# Patient Record
Sex: Male | Born: 1970
Health system: Southern US, Community
[De-identification: ages and names within clinical notes are randomized; demographics above are authoritative.]

## PROBLEM LIST (undated history)

## (undated) DIAGNOSIS — T7840XA Allergy, unspecified, initial encounter: Secondary | ICD-10-CM

## (undated) DIAGNOSIS — J302 Other seasonal allergic rhinitis: Secondary | ICD-10-CM

## (undated) DIAGNOSIS — J45909 Unspecified asthma, uncomplicated: Secondary | ICD-10-CM

## (undated) HISTORY — DX: Allergy, unspecified, initial encounter: T78.40XA

---

## 2006-08-30 ENCOUNTER — Ambulatory Visit: Payer: Self-pay | Admitting: Family Medicine

## 2006-09-02 ENCOUNTER — Encounter: Payer: Self-pay | Admitting: Family Medicine

## 2006-09-02 LAB — CONVERTED CEMR LAB
AST: 23 units/L (ref 0–37)
Albumin: 4.5 g/dL (ref 3.5–5.2)
BUN: 11 mg/dL (ref 6–23)
CO2: 27 meq/L (ref 19–32)
Calcium: 9.7 mg/dL (ref 8.4–10.5)
Cholesterol, target level: 200 mg/dL
Creatinine, Ser: 1.15 mg/dL (ref 0.40–1.50)
Eosinophils Relative: 3 % (ref 0–5)
HCT: 49.6 % (ref 39.0–52.0)
HDL: 36 mg/dL — ABNORMAL LOW (ref >39)
Hemoglobin: 16.2 g/dL (ref 13.0–17.0)
LDL Cholesterol: 89 mg/dL (ref 0–99)
Lymphs Abs: 1.7 10*3/uL (ref 0.7–3.3)
MCHC: 32.7 g/dL (ref 30.0–36.0)
Monocytes Relative: 9 % (ref 3–11)
RBC: 5.6 M/uL (ref 4.22–5.81)
Sodium: 140 meq/L (ref 135–145)
Total Bilirubin: 0.6 mg/dL (ref 0.3–1.2)
Total CHOL/HDL Ratio: 4.2
Triglycerides: 137 mg/dL (ref <150)
VLDL: 27 mg/dL (ref 0–40)
WBC: 5.6 10*3/uL (ref 4.0–10.5)

## 2006-10-30 ENCOUNTER — Encounter: Payer: Self-pay | Admitting: Family Medicine

## 2006-11-22 ENCOUNTER — Ambulatory Visit: Payer: Self-pay | Admitting: Family Medicine

## 2006-11-22 DIAGNOSIS — D179 Benign lipomatous neoplasm, unspecified: Secondary | ICD-10-CM | POA: Insufficient documentation

## 2006-11-22 DIAGNOSIS — E785 Hyperlipidemia, unspecified: Secondary | ICD-10-CM | POA: Insufficient documentation

## 2007-02-12 ENCOUNTER — Ambulatory Visit: Payer: Self-pay | Admitting: Family Medicine

## 2007-02-12 DIAGNOSIS — K219 Gastro-esophageal reflux disease without esophagitis: Secondary | ICD-10-CM | POA: Insufficient documentation

## 2007-02-12 DIAGNOSIS — J309 Allergic rhinitis, unspecified: Secondary | ICD-10-CM | POA: Insufficient documentation

## 2007-04-03 ENCOUNTER — Ambulatory Visit: Payer: Self-pay | Admitting: Family Medicine

## 2007-04-03 DIAGNOSIS — J329 Chronic sinusitis, unspecified: Secondary | ICD-10-CM | POA: Insufficient documentation

## 2007-04-03 DIAGNOSIS — J45901 Unspecified asthma with (acute) exacerbation: Secondary | ICD-10-CM | POA: Insufficient documentation

## 2007-04-04 LAB — CONVERTED CEMR LAB
Albumin: 4.7 g/dL (ref 3.5–5.2)
Chloride: 105 meq/L (ref 96–112)
Glucose, Bld: 138 mg/dL — ABNORMAL HIGH (ref 70–99)
Sodium: 137 meq/L (ref 135–145)
Total Protein: 7.7 g/dL (ref 6.0–8.3)

## 2007-09-12 ENCOUNTER — Ambulatory Visit: Payer: Self-pay | Admitting: Family Medicine

## 2007-09-17 ENCOUNTER — Encounter: Admission: RE | Admit: 2007-09-17 | Discharge: 2007-09-17 | Payer: Self-pay | Admitting: Family Medicine

## 2007-09-17 ENCOUNTER — Telehealth: Payer: Self-pay | Admitting: Family Medicine

## 2007-09-17 ENCOUNTER — Encounter: Payer: Self-pay | Admitting: Family Medicine

## 2007-09-17 DIAGNOSIS — K7689 Other specified diseases of liver: Secondary | ICD-10-CM | POA: Insufficient documentation

## 2007-09-18 LAB — CONVERTED CEMR LAB
AST: 23 units/L (ref 0–37)
Albumin: 4.6 g/dL (ref 3.5–5.2)
CO2: 24 meq/L (ref 19–32)
Calcium: 9.8 mg/dL (ref 8.4–10.5)
Chloride: 104 meq/L (ref 96–112)
Creatinine, Ser: 1.1 mg/dL (ref 0.40–1.50)
Glucose, Bld: 107 mg/dL — ABNORMAL HIGH (ref 70–99)
Lymphocytes Relative: 24 % (ref 12–46)
Lymphs Abs: 1.5 10*3/uL (ref 0.7–4.0)
MCV: 85.5 fL (ref 78.0–100.0)
Monocytes Absolute: 0.5 10*3/uL (ref 0.1–1.0)
Neutro Abs: 4.3 10*3/uL (ref 1.7–7.7)
Platelets: 283 10*3/uL (ref 150–400)
Potassium: 4.6 meq/L (ref 3.5–5.3)
Sodium: 140 meq/L (ref 135–145)
Total Bilirubin: 0.4 mg/dL (ref 0.3–1.2)

## 2007-09-20 ENCOUNTER — Ambulatory Visit (HOSPITAL_COMMUNITY): Admission: RE | Admit: 2007-09-20 | Discharge: 2007-09-20 | Payer: Self-pay | Admitting: Family Medicine

## 2007-09-26 ENCOUNTER — Encounter: Payer: Self-pay | Admitting: Family Medicine

## 2007-10-03 ENCOUNTER — Encounter: Payer: Self-pay | Admitting: Family Medicine

## 2007-10-06 ENCOUNTER — Encounter: Payer: Self-pay | Admitting: Family Medicine

## 2007-11-13 ENCOUNTER — Telehealth: Payer: Self-pay | Admitting: Family Medicine

## 2008-11-16 ENCOUNTER — Ambulatory Visit: Payer: Self-pay | Admitting: Family Medicine

## 2009-02-02 ENCOUNTER — Ambulatory Visit: Payer: Self-pay | Admitting: Family Medicine

## 2009-02-02 DIAGNOSIS — K828 Other specified diseases of gallbladder: Secondary | ICD-10-CM | POA: Insufficient documentation

## 2009-02-03 ENCOUNTER — Encounter: Admission: RE | Admit: 2009-02-03 | Discharge: 2009-02-03 | Payer: Self-pay | Admitting: Family Medicine

## 2009-02-03 LAB — CONVERTED CEMR LAB
ALT: 43 units/L (ref 0–53)
BUN: 14 mg/dL (ref 6–23)
CO2: 23 meq/L (ref 19–32)
Calcium: 9.8 mg/dL (ref 8.4–10.5)
Chloride: 103 meq/L (ref 96–112)
Cholesterol: 181 mg/dL (ref 0–200)
Glucose, Bld: 92 mg/dL (ref 70–99)
HDL: 35 mg/dL — ABNORMAL LOW (ref >39)
LDL Cholesterol: 122 mg/dL — ABNORMAL HIGH (ref 0–99)
Triglycerides: 120 mg/dL (ref <150)
VLDL: 24 mg/dL (ref 0–40)

## 2009-04-27 ENCOUNTER — Ambulatory Visit: Payer: Self-pay | Admitting: Family Medicine

## 2009-06-08 ENCOUNTER — Ambulatory Visit: Payer: Self-pay | Admitting: Family Medicine

## 2009-06-08 ENCOUNTER — Telehealth: Payer: Self-pay | Admitting: Family Medicine

## 2009-06-08 ENCOUNTER — Encounter: Admission: RE | Admit: 2009-06-08 | Discharge: 2009-06-08 | Payer: Self-pay | Admitting: Family Medicine

## 2009-06-17 ENCOUNTER — Encounter: Payer: Self-pay | Admitting: Family Medicine

## 2009-06-22 ENCOUNTER — Encounter: Payer: Self-pay | Admitting: Family Medicine

## 2009-06-23 ENCOUNTER — Encounter: Payer: Self-pay | Admitting: Family Medicine

## 2009-06-24 ENCOUNTER — Encounter: Payer: Self-pay | Admitting: Family Medicine

## 2009-10-21 ENCOUNTER — Ambulatory Visit: Payer: Self-pay | Admitting: Family Medicine

## 2009-11-01 ENCOUNTER — Ambulatory Visit: Payer: Self-pay | Admitting: Family Medicine

## 2010-02-27 ENCOUNTER — Encounter: Payer: Self-pay | Admitting: Family Medicine

## 2010-03-08 NOTE — Letter (Signed)
Summary: Letter to Patient with Path Results/[facility]  Letter to Patient with Path Results/[facility]   Imported By: Lanelle Bal 08/03/2009 11:57:47  _____________________________________________________________________  External Attachment:    Type:   Image     Comment:   External Document

## 2010-03-08 NOTE — Assessment & Plan Note (Signed)
Summary: Sinusitis   Vital Signs:  Patient profile:   40 year old male Height:      68.5 inches Weight:      193 pounds BMI:     29.02 O2 Sat:      96 % on Room air Temp:     97.9 degrees F oral Pulse rate:   81 / minute BP sitting:   119 / 60  (right arm) Cuff size:   regular  Vitals Entered By: Avon Gully CMA, (AAMA) (November 01, 2009 1:50 PM)  O2 Flow:  Room air CC: productive cough,HA,chills,fever   Primary Care Provider:  Nani Gasser MD  CC:  productive cough, HA, chills, and fever.  History of Present Illness: productive cough,HA,chills,fever. Yesterday ran a fever.  Took IBUprofen.  No new sxs. Not better overall. Now having loose bowels.   Current Medications (verified): 1)  Proventil Hfa 108 (90 Base) Mcg/act  Aers (Albuterol Sulfate) .... 2 Puffs Q 6 Hrs Prn 2)  Allegra-D 12 Hour 60-120 Mg  Tb12 (Fexofenadine-Pseudoephedrine) .Marland Kitchen.. 1 Tab By Mouth Q 12 Hrs As Needed Allergies 3)  Albuterol Sulfate 0.63 Mg/4ml  Nebu (Albuterol Sulfate) .... One Neb Tx Two Times A Day As Needed 4)  Flovent Hfa 110 Mcg/act Aero (Fluticasone Propionate  Hfa) .... 2 Puffs Inhaled Two Times A Day  Allergies (verified): No Known Drug Allergies  Comments:  Nurse/Medical Assistant: The patient's medications and allergies were reviewed with the patient and were updated in the Medication and Allergy Lists. Avon Gully CMA, Duncan Dull) (November 01, 2009 1:52 PM)  Physical Exam  General:  Well-developed,well-nourished,in no acute distress; alert,appropriate and cooperative throughout examination Head:  Normocephalic and atraumatic without obvious abnormalities. No apparent alopecia or balding. Eyes:  No corneal or conjunctival inflammation noted. EOMI. Perrla. Ears:  External ear exam shows no significant lesions or deformities.  Otoscopic examination reveals clear canals, tympanic membranes are intact bilaterally without bulging, retraction, inflammation or discharge.  Hearing is grossly normal bilaterally. Nose:  External nasal examination shows no deformity or inflammation. Nasal mucosa are pink and moist without lesions or exudates. Mouth:  Oral mucosa and oropharynx without lesions or exudates.  Teeth in good repair. Neck:  No deformities, masses, or tenderness noted. Lungs:  Normal respiratory effort, chest expands symmetrically. Lungs are clear to auscultation, no crackles or wheezes. Heart:  Normal rate and regular rhythm. S1 and S2 normal without gallop, murmur, click, rub or other extra sounds. Skin:  no rashes.   Psych:  Cognition and judgment appear intact. Alert and cooperative with normal attention span and concentration. No apparent delusions, illusions, hallucinations   Impression & Recommendations:  Problem # 1:  SINUSITIS (ICD-473.9)  His updated medication list for this problem includes:    Allegra-d 12 Hour 60-120 Mg Tb12 (Fexofenadine-pseudoephedrine) .Marland Kitchen... 1 tab by mouth q 12 hrs as needed allergies    Amoxicillin 875 Mg Tabs (Amoxicillin) .Marland Kitchen... Take 1 tablet by mouth two times a day for 10 days    Tessalon 200 Mg Caps (Benzonatate) .Marland Kitchen... Take 1 tablet by mouth three times a day as needed  Take antibiotics for full duration.   Complete Medication List: 1)  Proventil Hfa 108 (90 Base) Mcg/act Aers (Albuterol sulfate) .... 2 puffs q 6 hrs prn 2)  Allegra-d 12 Hour 60-120 Mg Tb12 (Fexofenadine-pseudoephedrine) .Marland Kitchen.. 1 tab by mouth q 12 hrs as needed allergies 3)  Albuterol Sulfate 0.63 Mg/81ml Nebu (Albuterol sulfate) .... One neb tx two times a day as needed 4)  Flovent Hfa 110 Mcg/act Aero (Fluticasone propionate  hfa) .... 2 puffs inhaled two times a day 5)  Amoxicillin 875 Mg Tabs (Amoxicillin) .... Take 1 tablet by mouth two times a day for 10 days 6)  Tessalon 200 Mg Caps (Benzonatate) .... Take 1 tablet by mouth three times a day as needed  Patient Instructions: 1)  Finish the antibiotic. Calll if not better if by the end of the  week.  Prescriptions: TESSALON 200 MG CAPS (BENZONATATE) Take 1 tablet by mouth three times a day as needed  #60 x 0   Entered and Authorized by:   Nani Gasser MD   Signed by:   Nani Gasser MD on 11/01/2009   Method used:   Electronically to        [facility] [phone]* (retail)       [address]       [place], [place]  [address]       Ph: [phone]       Fax: [phone]   RxID:   [id number] AMOXICILLIN 875 MG TABS (AMOXICILLIN) Take 1 tablet by mouth two times a day for 10 days  #20 x 0   Entered and Authorized by:   Nani Gasser MD   Signed by:   Nani Gasser MD on 11/01/2009   Method used:   Electronically to        [facility] [phone]* (retail)       [address]       [place], [place]  [address]       Ph: [phone]       Fax: [phone]   RxID:   [id number]

## 2010-03-08 NOTE — Miscellaneous (Signed)
Summary: colonoscopy  Clinical Lists Changes  Observations: Added new observation of COLONRECACT: Repeat colonoscopy in 10 years.   (06/22/2009 12:41) Added new observation of COLONOSCOPY: Location:  [facility].    erythema and infalmmation in the gastroesophageal junction compatible with esophagitis.  erythema in the duodenal bulb compatible with duodenitis. normal colonoscopy to the terminal ileum. (06/22/2009 12:41)      Colonoscopy  Procedure date:  06/22/2009  Findings:      Location:  [facility].    erythema and infalmmation in the gastroesophageal junction compatible with esophagitis.  erythema in the duodenal bulb compatible with duodenitis. normal colonoscopy to the terminal ileum.  Comments:      Repeat colonoscopy in 10 years.     Colonoscopy  Procedure date:  06/22/2009  Findings:      Location:  [facility].    erythema and infalmmation in the gastroesophageal junction compatible with esophagitis.  erythema in the duodenal bulb compatible with duodenitis. normal colonoscopy to the terminal ileum.  Comments:      Repeat colonoscopy in 10 years.

## 2010-03-08 NOTE — Assessment & Plan Note (Signed)
Summary: Enteritis   Vital Signs:  Patient profile:   40 year old male Height:      68.5 inches Weight:      191 pounds Temp:     97.7 degrees F oral Pulse rate:   66 / minute BP sitting:   115 / 65  (left arm) Cuff size:   regular  Vitals Entered By: Kathlene November (April 27, 2009 2:39 PM) CC: diarrhea since yesterday- getting better today but needs note for work for yesterday and today   Primary Care Provider:  Nani Gasser MD  CC:  diarrhea since yesterday- getting better today but needs note for work for yesterday and today.  History of Present Illness: diarrhea since yesterday- getting better today but needs note for work for yesterday and today.  No N?V. No blood in teh stool. Stools were green. Thinks had enteritis.  Feeling better today. Less frequent.  Not quite formed stools. No new abdominal pain or fever.  Not taking any meds for it. has tired to inbcrase hydration.   Current Medications (verified): 1)  Proventil Hfa 108 (90 Base) Mcg/act  Aers (Albuterol Sulfate) .... 2 Puffs Q 6 Hrs Prn 2)  Allegra-D 12 Hour 60-120 Mg  Tb12 (Fexofenadine-Pseudoephedrine) .Marland Kitchen.. 1 Tab By Mouth Q 12 Hrs As Needed Allergies 3)  Albuterol Sulfate 0.63 Mg/23ml  Nebu (Albuterol Sulfate) .... One Neb Tx Two Times A Day As Needed 4)  Flovent Hfa 110 Mcg/act Aero (Fluticasone Propionate  Hfa) .... 2 Puffs Inhaled Two Times A Day  Allergies (verified): No Known Drug Allergies  Comments:  Nurse/Medical Assistant: The patient's medications and allergies were reviewed with the patient and were updated in the Medication and Allergy Lists. Kathlene November (April 27, 2009 2:40 PM)  Past History:  Past Surgical History: Last updated: 08/30/2006 Vasectomy Knee scope surgery-right, meniscus repair  Physical Exam  General:  Well-developed,well-nourished,in no acute distress; alert,appropriate and cooperative throughout examination Lungs:  Normal respiratory effort, chest expands symmetrically.  Lungs are clear to auscultation, no crackles or wheezes. Heart:  Normal rate and regular rhythm. S1 and S2 normal without gallop, murmur, click, rub or other extra sounds. Abdomen:  Bowel sounds positive,abdomen soft and non-tender without masses, organomegaly or hernias noted.   Impression & Recommendations:  Problem # 1:  ENTERITIS (ICD-558.9) Likely viral.  Increase hydration. Call if not better in 2-3 days.  Will get work note.  Avoid immodium.   Complete Medication List: 1)  Proventil Hfa 108 (90 Base) Mcg/act Aers (Albuterol sulfate) .... 2 puffs q 6 hrs prn 2)  Allegra-d 12 Hour 60-120 Mg Tb12 (Fexofenadine-pseudoephedrine) .Marland Kitchen.. 1 tab by mouth q 12 hrs as needed allergies 3)  Albuterol Sulfate 0.63 Mg/41ml Nebu (Albuterol sulfate) .... One neb tx two times a day as needed 4)  Flovent Hfa 110 Mcg/act Aero (Fluticasone propionate  hfa) .... 2 puffs inhaled two times a day

## 2010-03-08 NOTE — Consult Note (Signed)
Summary: [facility]  [facility]   Imported By: Lanelle Bal 06/27/2009 13:03:12  _____________________________________________________________________  External Attachment:    Type:   Image     Comment:   External Document

## 2010-03-08 NOTE — Assessment & Plan Note (Signed)
Summary: URI   Vital Signs:  Patient profile:   40 year old male Height:      68.5 inches Weight:      193 pounds O2 Sat:      98 % on Room air Temp:     97.9 degrees F oral Pulse rate:   65 / minute BP sitting:   124 / 64  (left arm) Cuff size:   regular  Vitals Entered By: Kathlene November (October 21, 2009 9:39 AM)  O2 Flow:  Room air CC: bodyaches, H/A, chest congestion, cough x 3 days   Primary Care [place] Rommel:  Nani Gasser MD  CC:  bodyaches, H/A, chest congestion, and cough x 3 days.  History of Present Illness: bodyaches, H/A, chest congestion, cough x 3 days. No fever. Taking cough medicine. NO GI sxs. Son had similar illnes on Monday and now 5 days later he is better.   Current Medications (verified): 1)  Proventil Hfa 108 (90 Base) Mcg/act  Aers (Albuterol Sulfate) .... 2 Puffs Q 6 Hrs Prn 2)  Allegra-D 12 Hour 60-120 Mg  Tb12 (Fexofenadine-Pseudoephedrine) .Marland Kitchen.. 1 Tab By Mouth Q 12 Hrs As Needed Allergies 3)  Albuterol Sulfate 0.63 Mg/35ml  Nebu (Albuterol Sulfate) .... One Neb Tx Two Times A Day As Needed 4)  Flovent Hfa 110 Mcg/act Aero (Fluticasone Propionate  Hfa) .... 2 Puffs Inhaled Two Times A Day  Allergies (verified): No Known Drug Allergies  Comments:  Nurse/Medical Assistant: The patient's medications and allergies were reviewed with the patient and were updated in the Medication and Allergy Lists. Kathlene November (October 21, 2009 9:40 AM)  Social History: Works for [employer].   Married to [employer] with 3 kids.  30 year old from first marriage.  Former Smoker Alcohol use-no Drug use-no Regular exercise-no  Physical Exam  General:  Well-developed,well-nourished,in no acute distress; alert,appropriate and cooperative throughout examination Head:  Normocephalic and atraumatic without obvious abnormalities. No apparent alopecia or balding. Eyes:  No corneal or conjunctival inflammation noted. EOMI. Perrla.l. Ears:  External ear exam shows no  significant lesions or deformities.  Otoscopic examination reveals clear canals, tympanic membranes are intact bilaterally without bulging, retraction, inflammation or discharge. Hearing is grossly normal bilaterally. Nose:  External nasal examination shows no deformity or inflammation. Nasal mucosa are pink and moist without lesions or exudates. Mouth:  Oral mucosa and oropharynx without lesions or exudates.  Teeth in good repair. Neck:  No deformities, masses, or tenderness noted. Lungs:  Normal respiratory effort, chest expands symmetrically. Lungs are clear to auscultation, no crackles or wheezes. Heart:  Normal rate and regular rhythm. S1 and S2 normal without gallop, murmur, click, rub or other extra sounds. Skin:  no rashes.   Cervical Nodes:  No lymphadenopathy noted Psych:  Cognition and judgment appear intact. Alert and cooperative with normal attention span and concentration. No apparent delusions, illusions, hallucinations   Impression & Recommendations:  Problem # 1:  URI (ICD-465.9)  His updated medication list for this problem includes:    Allegra-d 12 Hour 60-120 Mg Tb12 (Fexofenadine-pseudoephedrine) .Marland Kitchen... 1 tab by mouth q 12 hrs as needed allergies  Instructed on symptomatic treatment. Call if symptoms persist or worsen.   Complete Medication List: 1)  Proventil Hfa 108 (90 Base) Mcg/act Aers (Albuterol sulfate) .... 2 puffs q 6 hrs prn 2)  Allegra-d 12 Hour 60-120 Mg Tb12 (Fexofenadine-pseudoephedrine) .Marland Kitchen.. 1 tab by mouth q 12 hrs as needed allergies 3)  Albuterol Sulfate 0.63 Mg/38ml Nebu (Albuterol sulfate) .Marland KitchenMarland KitchenMarland Kitchen  One neb tx two times a day as needed 4)  Flovent Hfa 110 Mcg/act Aero (Fluticasone propionate  hfa) .... 2 puffs inhaled two times a day  Patient Instructions: 1)  This is viral.  2)  Call if not better next week.

## 2010-03-08 NOTE — Progress Notes (Signed)
Summary: wants xray results  Phone Note Call from Patient   Caller: Patient Summary of Call: Pt. would like to know his xray results and wants a call back at [phone].Michaelle Copas  Jun 08, 2009 11:32 AM  Initial call taken by: Michaelle Copas,  Jun 08, 2009 11:32 AM  Follow-up for Phone Call        pt notified of results Follow-up by: Kathlene November,  Jun 08, 2009 11:38 AM

## 2010-03-08 NOTE — Letter (Signed)
Summary: Letter with Colonoscopy & EGD Results/[facility]  Letter with Colonoscopy & EGD Results/[facility]   Imported By: Lanelle Bal 07/07/2009 11:22:25  _____________________________________________________________________  External Attachment:    Type:   Image     Comment:   External Document

## 2010-03-08 NOTE — Letter (Signed)
Summary: Out of Work  [facility]  [address]   [place], [place] [address]   Phone: [phone]  Fax: [phone]    April 27, 2009   Employee:  Bradley Bernard    To Whom It May Concern:   For Medical reasons, please excuse the above named employee from work for the following dates:  Start:   04/26/2009  End:   04/28/2009  If you need additional information, please feel free to contact our office.         Sincerely,    Nani Gasser MD

## 2010-03-08 NOTE — Assessment & Plan Note (Signed)
Summary: RLQ pain   Vital Signs:  Patient profile:   40 year old male Height:      68.5 inches Weight:      195 pounds Pulse rate:   60 / minute BP sitting:   121 / 69  (left arm) Cuff size:   regular  Vitals Entered By: Kathlene November (Jun 08, 2009 8:57 AM) CC: right sided flank pain- bloated- BM not real regular   Primary Care Provider:  Nani Gasser MD  CC:  right sided flank pain- bloated- BM not real regular.  History of Present Illness: Right sided flank pain- bloated- BM not real regular.  No blood in the stool.  only happens when he overeats.  When eats well doesn't happen.  Gets more swollen on teh right side. Spicy foods also trigger teh bloating.  He says it doesn't really hurt but "It is a mild discomfort".  Has some pain last night.  Pain is mostly in the RLQ.  Blaoting is all over.  Struggles with constipation.     Current Medications (verified): 1)  Proventil Hfa 108 (90 Base) Mcg/act  Aers (Albuterol Sulfate) .... 2 Puffs Q 6 Hrs Prn 2)  Allegra-D 12 Hour 60-120 Mg  Tb12 (Fexofenadine-Pseudoephedrine) .Marland Kitchen.. 1 Tab By Mouth Q 12 Hrs As Needed Allergies 3)  Albuterol Sulfate 0.63 Mg/19ml  Nebu (Albuterol Sulfate) .... One Neb Tx Two Times A Day As Needed 4)  Flovent Hfa 110 Mcg/act Aero (Fluticasone Propionate  Hfa) .... 2 Puffs Inhaled Two Times A Day  Allergies (verified): No Known Drug Allergies  Comments:  Nurse/Medical Assistant: The patient's medications and allergies were reviewed with the patient and were updated in the Medication and Allergy Lists. Kathlene November (Jun 08, 2009 8:58 AM)  Physical Exam  General:  Well-developed,well-nourished,in no acute distress; alert,appropriate and cooperative throughout examination Head:  Normocephalic and atraumatic without obvious abnormalities. No apparent alopecia or balding. Lungs:  Normal respiratory effort, chest expands symmetrically. Lungs are clear to auscultation, no crackles or wheezes. Heart:  Normal  rate and regular rhythm. S1 and S2 normal without gallop, murmur, click, rub or other extra sounds. Abdomen:  soft, normal bowel sounds, no distention, no masses, no hepatomegaly, and no splenomegaly.  Mildly tender with deep palpation in the RLQ.   Skin:  no rashes.   Psych:  Cognition and judgment appear intact. Alert and cooperative with normal attention span and concentration. No apparent delusions, illusions, hallucinations   Impression & Recommendations:  Problem # 1:  ABDOMINAL PAIN, RIGHT LOWER QUADRANT (ICD-789.03) Dsicussed likely stool as seems to be present only when overeats or eats poorly.  Discussed potential w/u. Will start with a KUB. IF abnormal then will start wtih Miralax two times a day which is safe and doesn't cause cramping.  If normal then will refer to GI. Or if start mirilax and not better then will refer to GI for further evaluation.  Orders: T-*Unlisted Diagnostic X-ray test/procedure ([address])  Complete Medication List: 1)  Proventil Hfa 108 (90 Base) Mcg/act Aers (Albuterol sulfate) .... 2 puffs q 6 hrs prn 2)  Allegra-d 12 Hour 60-120 Mg Tb12 (Fexofenadine-pseudoephedrine) .Marland Kitchen.. 1 tab by mouth q 12 hrs as needed allergies 3)  Albuterol Sulfate 0.63 Mg/35ml Nebu (Albuterol sulfate) .... One neb tx two times a day as needed 4)  Flovent Hfa 110 Mcg/act Aero (Fluticasone propionate  hfa) .... 2 puffs inhaled two times a day

## 2010-03-08 NOTE — Letter (Signed)
Summary: Out of Work  [facility]  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         Sincerely,    Nani Gasser MD

## 2010-03-08 NOTE — Letter (Signed)
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         Sincerely,    Nani Gasser MD

## 2010-04-25 ENCOUNTER — Ambulatory Visit (INDEPENDENT_AMBULATORY_CARE_PROVIDER_SITE_OTHER): Payer: 59 | Admitting: Family Medicine

## 2010-04-25 VITALS — BP 119/78 | HR 87 | Wt 194.0 lb

## 2010-04-25 DIAGNOSIS — K219 Gastro-esophageal reflux disease without esophagitis: Secondary | ICD-10-CM

## 2010-04-25 MED ORDER — DEXLANSOPRAZOLE 60 MG PO CPDR: 60.0000 mg | DELAYED_RELEASE_CAPSULE | Freq: Every day | ORAL | Status: DC

## 2010-04-25 NOTE — Progress Notes (Signed)
  Subjective:    Patient ID: Bradley Bernard, male    DOB: 10-20-1970, 40 y.o.   MRN: [id number]  Back Pain This is a new problem. The current episode started in the past 7 days. The problem occurs daily. The problem has been gradually improving since onset. The pain is present in the lumbar spine. The quality of the pain is described as aching (couldnt wear a belt. ). The pain does not radiate. The pain is moderate. The pain is worse during the night. The symptoms are aggravated by sitting and bending. Stiffness is present all day. Pertinent negatives include no bladder incontinence, bowel incontinence or weakness. He has tried NSAIDs for the symptoms. The treatment provided moderate relief.    Missed 3 days of work for this.   Review of Systems  Gastrointestinal: Negative for bowel incontinence.  Genitourinary: Negative for bladder incontinence.  Musculoskeletal: Positive for back pain.  Neurological: Negative for weakness.       Objective:   Physical Exam  Vitals reviewed. Constitutional: He appears well-developed and well-nourished.  Musculoskeletal: Normal range of motion.       Lumbar back: He exhibits tenderness and spasm. He exhibits normal range of motion, no bony tenderness and no deformity.       Back:       Neg straight leg raise.  Hip, knee, and ankle strength 5/5 bilat.    BP 119/78  Pulse 87  Wt 194 lb (87.998 kg)       Assessment & Plan:  1. Acute BAck Pain- Discussed appropriate use of NSAIDs.  Avoid if any GI irritation. Says he already has some stretches at home and he says she will use them.  Overall he is better. Hold off on additional pain meds or muscle relaxers. Given work notes.

## 2010-04-25 NOTE — Patient Instructions (Signed)
Continue Ibuprofen up to 3 x a day. Stop immediately if bothers your stomach.  Work on gentle stretches.

## 2010-08-14 ENCOUNTER — Encounter: Payer: Self-pay | Admitting: Family Medicine

## 2010-08-14 ENCOUNTER — Ambulatory Visit (INDEPENDENT_AMBULATORY_CARE_PROVIDER_SITE_OTHER): Payer: 59 | Admitting: Family Medicine

## 2010-08-14 DIAGNOSIS — K5289 Other specified noninfective gastroenteritis and colitis: Secondary | ICD-10-CM

## 2010-08-14 DIAGNOSIS — K529 Noninfective gastroenteritis and colitis, unspecified: Secondary | ICD-10-CM

## 2010-08-14 DIAGNOSIS — J329 Chronic sinusitis, unspecified: Secondary | ICD-10-CM

## 2010-08-14 MED ORDER — AMOXICILLIN 875 MG PO TABS: 875.0000 mg | ORAL_TABLET | Freq: Two times a day (BID) | ORAL | Status: AC

## 2010-08-14 NOTE — Patient Instructions (Signed)
Call if not better in one week.  

## 2010-08-14 NOTE — Progress Notes (Signed)
  Subjective:    Patient ID: Bradley Bernard, male    DOB: [birth year], 40 y.o.   MRN: [id number]  HPI N/V/D started4 days ago. Last 2 4 hours but then better. Then started with a severe HA that he couldn't get rig of it.  Had chills last night.  Has post nasal drip and feels like has a brick on his face. Has tried his tylenol OTC meds that ae not helping.  Bad taste in his throat.  Not slept well because of sweats and bodyaches. Dry cough started last night.   Review of Systems     Objective:   Physical Exam  Constitutional: He is oriented to person, place, and time. He appears well-developed and well-nourished.  HENT:  Head: Normocephalic and atraumatic.  Right Ear: External ear normal.  Left Ear: External ear normal.  Nose: Nose normal.  Mouth/Throat: Oropharynx is clear and moist.       TMs and canals are clear.   Eyes: Conjunctivae and EOM are normal. Pupils are equal, round, and reactive to light.  Neck: Neck supple. No thyromegaly present.  Cardiovascular: Normal rate and normal heart sounds.   Pulmonary/Chest: Effort normal and breath sounds normal.  Lymphadenopathy:    He has no cervical adenopathy.  Neurological: He is alert and oriented to person, place, and time.  Skin: Skin is warm and dry.  Psychiatric: He has a normal mood and affect.          Assessment & Plan:  Sinusitis - Tx with amox. Explained this still could be early viral illness. OTC symptomatic care. Call if not better in one week.

## 2010-11-20 ENCOUNTER — Encounter (INDEPENDENT_AMBULATORY_CARE_PROVIDER_SITE_OTHER): Payer: Self-pay | Admitting: *Deleted

## 2010-11-20 ENCOUNTER — Inpatient Hospital Stay (INDEPENDENT_AMBULATORY_CARE_PROVIDER_SITE_OTHER)
Admission: RE | Admit: 2010-11-20 | Discharge: 2010-11-20 | Disposition: A | Discharge status: Home / Self Care | Payer: 59 | Source: Ambulatory Visit | Attending: Emergency Medicine | Admitting: Emergency Medicine

## 2010-11-20 ENCOUNTER — Encounter: Payer: Self-pay | Admitting: Emergency Medicine

## 2010-11-20 DIAGNOSIS — R05 Cough: Secondary | ICD-10-CM

## 2010-11-20 DIAGNOSIS — J45909 Unspecified asthma, uncomplicated: Secondary | ICD-10-CM | POA: Insufficient documentation

## 2010-11-20 DIAGNOSIS — R059 Cough, unspecified: Secondary | ICD-10-CM

## 2010-11-20 DIAGNOSIS — J069 Acute upper respiratory infection, unspecified: Secondary | ICD-10-CM

## 2010-11-23 ENCOUNTER — Inpatient Hospital Stay (INDEPENDENT_AMBULATORY_CARE_PROVIDER_SITE_OTHER)
Admission: RE | Admit: 2010-11-23 | Discharge: 2010-11-23 | Disposition: A | Discharge status: Home / Self Care | Payer: 59 | Source: Ambulatory Visit | Attending: Family Medicine | Admitting: Family Medicine

## 2010-11-23 ENCOUNTER — Encounter: Payer: Self-pay | Admitting: Family Medicine

## 2010-11-23 DIAGNOSIS — J069 Acute upper respiratory infection, unspecified: Secondary | ICD-10-CM

## 2010-11-23 DIAGNOSIS — R05 Cough: Secondary | ICD-10-CM

## 2010-11-23 DIAGNOSIS — R059 Cough, unspecified: Secondary | ICD-10-CM

## 2010-11-23 DIAGNOSIS — J45909 Unspecified asthma, uncomplicated: Secondary | ICD-10-CM

## 2011-01-08 NOTE — Progress Notes (Signed)
Summary: Head cold/sore throat/severe cough (rm 5)   Vital Signs:  Patient Profile:   40 Years Old Male CC:      dry cough, congestion, chills, HA, no improvement from 3 days ago Height:     68.5 inches Weight:      192 pounds O2 Sat:      97 % O2 treatment:    Room Air Temp:     98.3 degrees F oral Pulse rate:   74 / minute Resp:     18 per minute BP sitting:   140 / 83  (left arm) Cuff size:   large  Vitals Entered By: Lajean Saver RN (November 23, 2010 8:19 AM)                  Updated Prior Medication List: PROVENTIL HFA 108 (90 BASE) MCG/ACT  AERS (ALBUTEROL SULFATE) 2 puffs q 6 hrs prn ALBUTEROL SULFATE 0.63 MG/3ML  NEBU (ALBUTEROL SULFATE) one neb tx two times a day as needed PULMICORT 0.5 MG/2ML SUSP (BUDESONIDE)  PREDNISONE (PAK) 10 MG TABS (PREDNISONE) 6 day pack, use as directed AUGMENTIN 875-125 MG TABS (AMOXICILLIN-POT CLAVULANATE) 1 by mouth two times a day for 7 days  Current Allergies: No known allergies History of Present Illness Chief Complaint: dry cough, congestion, chills, HA, no improvement from 3 days ago History of Present Illness:  Subjective:  Patient returns reporting that he has had no improvement since previous visit.  Non productive cough has become worse, especially at night.  He has persistent nasal congestion and mild sore throat.  He continues to have chills.  No pleuritic pain   REVIEW OF SYSTEMS Constitutional Symptoms       Complains of chills and fatigue.     Denies fever, night sweats, weight loss, and weight gain.  Eyes       Denies change in vision, eye pain, eye discharge, glasses, contact lenses, and eye surgery. Ear/Nose/Throat/Mouth       Complains of frequent runny nose and sinus problems.      Denies hearing loss/aids, change in hearing, ear pain, ear discharge, dizziness, frequent nose bleeds, sore throat, hoarseness, and tooth pain or bleeding.  Respiratory       Complains of dry cough.      Denies productive cough,  wheezing, shortness of breath, asthma, bronchitis, and emphysema/COPD.  Cardiovascular       Denies murmurs, chest pain, and tires easily with exhertion.    Gastrointestinal       Denies stomach pain, nausea/vomiting, diarrhea, constipation, blood in bowel movements, and indigestion. Genitourniary       Denies painful urination, blood or discharge from penis, kidney stones, and loss of urinary control. Neurological       Complains of headaches.      Denies paralysis, seizures, and fainting/blackouts. Musculoskeletal       Denies muscle pain, joint pain, joint stiffness, decreased range of motion, redness, swelling, muscle weakness, and gout.  Skin       Denies bruising, unusual mles/lumps or sores, and hair/skin or nail changes.  Psych       Denies mood changes, temper/anger issues, anxiety/stress, speech problems, depression, and sleep problems. Other Comments: No improvement from treatment 3 days ago. Taking antibiotic and steroid   Past History:  Past Medical History: Reviewed history from 11/20/2010 and no changes required. Asthma  Past Surgical History: Reviewed history from 08/30/2006 and no changes required. Vasectomy Knee scope surgery-right, meniscus repair  Family History: Reviewed history  from 02/02/2009 and no changes required. Mother and dad with cholesterol Dad with HTN Mother with BrCa, dx in her 41.    Social History: Reviewed history from 11/20/2010 and no changes required. Works for [employer].   Married to [employer] with 3 kids.  91 year old from first marriage.  Former Smoker Alcohol use-no Drug use-no Regular exercise-no   Objective:  Appearance:  Patient appears healthy, stated age, and in no acute distress  Eyes:  Pupils are equal, round, and reactive to light and accomodation.  Extraocular movement is intact.  Conjunctivae are not inflamed.  Ears:  Canals normal.  Tympanic membranes normal.   Nose:  Mildly congested turbinates.  No sinus  tenderness  Pharynx:  Normal  Neck:  Supple.  Tender shotty posterior nodes are palpated bilaterally.  Lungs:  Clear to auscultation.  Breath sounds are equal.  Heart:  Regular rate and rhythm without murmurs, rubs, or gallops.  Abdomen:  Nontender without masses or hepatosplenomegaly.  Bowel sounds are present.  No CVA or flank tenderness.  Extremities:  No edema.   Skin:  No rash Assessment  Assessed UPPER RESPIRATORY INFECTION, ACUTE as unchanged - Donna Christen MD Assessed ASTHMA as unchanged - Donna Christen MD Assessed COUGH as deteriorated - Donna Christen MD  Plan New Medications/Changes: Elwin Sleight 100-5 MCG/ACT AERO (MOMETASONE FURO-FORMOTEROL FUM) 2 puffs bid  #13gm x 0, 11/23/2010, Donna Christen MD BENZONATATE 200 MG CAPS (BENZONATATE) One by mouth hs as needed cough  #12 x 0, 11/23/2010, Donna Christen MD AZITHROMYCIN 250 MG TABS (AZITHROMYCIN) Two tabs by mouth on day 1, then 1 tab daily on days 2 through 5  #6 tabs x 0, 11/23/2010, Donna Christen MD  New Orders: Pulse Oximetry (single measurment) [94760] Est. Patient Level IV [45409] Planning Comments:   Previous notes and chart reviewed. Continue prednisone.  Use albuterol MDI as needed.  May continue nebulizer at home Patient would benefit from a long-acting bronchodilator.  He states that he could not tolerate the dry powder formulation of Advair.  Will discontinue Pulmicort for now and substitute Dulera. Discontinue Augmentin and switch to azithromycin. Begin expectorant/decongestant, topical decongestant, and cough suppressant at bedtime.  Increase fluid intake. Follow-up with PCP if not improving.   The patient and/or caregiver has been counseled thoroughly with regard to medications prescribed including dosage, schedule, interactions, rationale for use, and possible side effects and they verbalize understanding.  Diagnoses and expected course of recovery discussed and will return if not improved as expected or if the  condition worsens. Patient and/or caregiver verbalized understanding.  Prescriptions: DULERA 100-5 MCG/ACT AERO (MOMETASONE FURO-FORMOTEROL FUM) 2 puffs bid  #13gm x 0   Entered and Authorized by:   Donna Christen MD   Signed by:   Donna Christen MD on 11/23/2010   Method used:   Print then Give to Patient   RxID:   [id number] BENZONATATE 200 MG CAPS (BENZONATATE) One by mouth hs as needed cough  #12 x 0   Entered and Authorized by:   Donna Christen MD   Signed by:   Donna Christen MD on 11/23/2010   Method used:   Print then Give to Patient   RxID:   [id number] AZITHROMYCIN 250 MG TABS (AZITHROMYCIN) Two tabs by mouth on day 1, then 1 tab daily on days 2 through 5  #6 tabs x 0   Entered and Authorized by:   Donna Christen MD   Signed by:   Donna Christen MD on 11/23/2010  Method used:   Print then Give to Patient   RxID:   [id number]   Patient Instructions: 1)  Take Mucinex D (guaifenesin with decongestant) twice daily for congestion. 2)  Increase fluid intake, rest. 3)  May use Afrin nasal spray (or generic oxymetazoline) twice daily for about 5 days.  Also recommend using saline nasal spray several times daily and/or saline nasal irrigation. 4)  Discontinue Augmentin and begin Azithromycin.  Finish prednisone. Discontinue Pulmicort for now and switch to [facility] 5)  Followup with family doctor if not improving 7 to 10 days.   Orders Added: 1)  Pulse Oximetry (single measurment) [94760] 2)  Est. Patient Level IV [14782]

## 2011-01-08 NOTE — Letter (Signed)
Summary: Out of Work  [facility]  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         Sincerely,    Clemens Catholic LPN

## 2011-01-08 NOTE — Letter (Signed)
Summary: Out of Work  [facility]  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   If you need additional information, please feel free to contact our office.         Sincerely,    Donna Christen MD

## 2011-01-08 NOTE — Progress Notes (Signed)
Summary: Possible Sinus Infection/bad cough rm 4   Vital Signs:  Patient Profile:   40 Years Old Male CC:      dry cough, head congestion, HA, and chills x 3 days Height:     68.5 inches Weight:      192.50 pounds O2 Sat:      97 % O2 treatment:    Room Air Temp:     97.9 degrees F oral Pulse rate:   62 / minute Resp:     18 per minute BP sitting:   116 / 77  (left arm) Cuff size:   large  Vitals Entered By: Clemens Catholic LPN (November 20, 2010 8:30 AM)                  Updated Prior Medication List: PROVENTIL HFA 108 (90 BASE) MCG/ACT  AERS (ALBUTEROL SULFATE) 2 puffs q 6 hrs prn ALBUTEROL SULFATE 0.63 MG/3ML  NEBU (ALBUTEROL SULFATE) one neb tx two times a day as needed PULMICORT 0.5 MG/2ML SUSP (BUDESONIDE)   Current Allergies (reviewed today): No known allergies History of Present Illness Chief Complaint: dry cough, head congestion, HA, and chills x 3 days History of Present Illness: 40 Years Old Male complains of onset of cold symptoms for 4 days.  Bradley Bernard has been using Albuterol which is helping a little bit. No sore throat +cough No pleuritic pain +wheezing + nasal congestion +post-nasal drainage + sinus pain/pressure + chest congestion No itchy/red eyes No earache No hemoptysis No SOB No chills/sweats No fever No nausea No vomiting No abdominal pain No diarrhea No skin rashes No fatigue No myalgias No headache   REVIEW OF SYSTEMS Constitutional Symptoms       Complains of chills and night sweats.     Denies fever, weight loss, weight gain, and fatigue.  Eyes       Denies change in vision, eye pain, eye discharge, glasses, contact lenses, and eye surgery. Ear/Nose/Throat/Mouth       Complains of sinus problems and hoarseness.      Denies hearing loss/aids, change in hearing, ear pain, ear discharge, dizziness, frequent runny nose, frequent nose bleeds, sore throat, and tooth pain or bleeding.  Respiratory       Complains of dry cough,  wheezing, and asthma.      Denies productive cough, shortness of breath, bronchitis, and emphysema/COPD.  Cardiovascular       Denies murmurs, chest pain, and tires easily with exhertion.    Gastrointestinal       Denies stomach pain, nausea/vomiting, diarrhea, constipation, blood in bowel movements, and indigestion. Genitourniary       Denies painful urination, kidney stones, and loss of urinary control. Neurological       Complains of headaches.      Denies paralysis, seizures, and fainting/blackouts. Musculoskeletal       Denies muscle pain, joint pain, joint stiffness, decreased range of motion, redness, swelling, muscle weakness, and gout.  Skin       Denies bruising, unusual mles/lumps or sores, and hair/skin or nail changes.  Psych       Denies mood changes, temper/anger issues, anxiety/stress, speech problems, depression, and sleep problems. Other Comments: pt c/o dry cough, head congestion, HA, and chills x 3 days. He has taken Robt and Mucinex with no relief.   Past History:  Past Medical History: Asthma  Past Surgical History: Reviewed history from 08/30/2006 and no changes required. Vasectomy Knee scope surgery-right, meniscus repair  Family History: Reviewed history  from 02/02/2009 and no changes required. Mother and dad with cholesterol Dad with HTN Mother with BrCa, dx in her 37.    Social History: Works for [employer].   Married to [employer] with 3 kids.  38 year old from first marriage.  Former Smoker Alcohol use-no Drug use-no Regular exercise-no Physical Exam General appearance: well developed, well nourished, no acute distress other than mod cough Ears: normal, no lesions or deformities Nasal: clear discharge Oral/Pharynx: clear PND, no erythema, no exudate Chest/Lungs: no rales, wheezes, or rhonchi bilateral, breath sounds equal without effort Heart: regular rate and  rhythm, no murmur MSE: oriented to time, place, and person Assessment New  Problems: COUGH (ICD-786.2) ASTHMA (ICD-493.90) UPPER RESPIRATORY INFECTION, ACUTE (ICD-465.9)   Plan New Medications/Changes: AUGMENTIN 875-125 MG TABS (AMOXICILLIN-POT CLAVULANATE) 1 by mouth two times a day for 7 days  #14 x 0, 11/20/2010, Hoyt Koch MD PREDNISONE (PAK) 10 MG TABS (PREDNISONE) 6 day pack, use as directed  #1 x 0, 11/20/2010, Hoyt Koch MD  New Orders: New Patient Level III [id number] Pulse Oximetry (single measurment) [94760] Planning Comments:   1)  Take the prescribed antibiotic as instructed.   Continue your inhaler at home, use prednisone as directed. 2)  Use nasal saline solution (over the counter) at least 3 times a day. 3)  Use over the counter decongestants like Zyrtec-D every 12 hours as needed to help with congestion. 4)  Can take tylenol every 6 hours or motrin every 8 hours for pain or fever. 5)  Follow up with your primary doctor  if no improvement in 5-7 days, sooner if increasing pain, fever, or new symptoms.    The patient and/or caregiver has been counseled thoroughly with regard to medications prescribed including dosage, schedule, interactions, rationale for use, and possible side effects and they verbalize understanding.  Diagnoses and expected course of recovery discussed and will return if not improved as expected or if the condition worsens. Patient and/or caregiver verbalized understanding.  Prescriptions: AUGMENTIN 875-125 MG TABS (AMOXICILLIN-POT CLAVULANATE) 1 by mouth two times a day for 7 days  #14 x 0   Entered and Authorized by:   Hoyt Koch MD   Signed by:   Hoyt Koch MD on 11/20/2010   Method used:   Print then Give to Patient   RxID:   [id number] PREDNISONE (PAK) 10 MG TABS (PREDNISONE) 6 day pack, use as directed  #1 x 0   Entered and Authorized by:   Hoyt Koch MD   Signed by:   Hoyt Koch MD on 11/20/2010   Method used:   Print then Give to Patient   RxID:    [id number]   Orders Added: 1)  New Patient Level III [13244] 2)  Pulse Oximetry (single measurment) [01027]

## 2011-01-10 ENCOUNTER — Encounter: Payer: Self-pay | Admitting: *Deleted

## 2011-01-10 ENCOUNTER — Ambulatory Visit (INDEPENDENT_AMBULATORY_CARE_PROVIDER_SITE_OTHER): Payer: 59 | Admitting: Family Medicine

## 2011-01-10 ENCOUNTER — Telehealth: Payer: Self-pay | Admitting: *Deleted

## 2011-01-10 VITALS — BP 116/59 | HR 77 | Temp 98.3°F

## 2011-01-10 DIAGNOSIS — J111 Influenza due to unidentified influenza virus with other respiratory manifestations: Secondary | ICD-10-CM

## 2011-01-10 DIAGNOSIS — R05 Cough: Secondary | ICD-10-CM

## 2011-01-10 DIAGNOSIS — R059 Cough, unspecified: Secondary | ICD-10-CM

## 2011-01-10 DIAGNOSIS — R509 Fever, unspecified: Secondary | ICD-10-CM

## 2011-01-10 DIAGNOSIS — R51 Headache: Secondary | ICD-10-CM

## 2011-01-10 LAB — POCT INFLUENZA A/B
Influenza A, POC: NEGATIVE
Influenza B, POC: NEGATIVE

## 2011-01-10 MED ORDER — OSELTAMIVIR PHOSPHATE 75 MG PO CAPS: 75.0000 mg | ORAL_CAPSULE | Freq: Two times a day (BID) | ORAL | Status: AC

## 2011-01-10 NOTE — Progress Notes (Signed)
  Subjective:    Patient ID: Bradley Bernard, male    DOB: 1970-09-23, 41 y.o.   MRN: [id number] Here for flu swab. Sx's are bodyaches, cough, chills, headache, congestion. Sx's started last night. Kids have tested positive for the flu HPI    Review of Systems     Objective:   Physical Exam        Assessment & Plan:  Likely flu- Sxs just started last night. Kids tested +. His test is neg but will go ahead and tx with Tamiflu.  Call if not getting any better.

## 2011-01-10 NOTE — Patient Instructions (Signed)
Influenza A (H1N1) H1N1 formerly called "swine flu" is a new influenza virus causing sickness in people. The H1N1 virus is different from seasonal influenza viruses. However, the H1N1 symptoms are similar to seasonal influenza and it is spread from person to person. You may be at higher risk for serious problems if you have underlying serious medical conditions. The CDC and the [employer] are following reported cases around the world. CAUSES    The flu is thought to spread mainly person-to-person through coughing or sneezing of infected people.     A person may become infected by touching something with the virus on it and then touching their mouth or nose.  SYMPTOMS    Fever.     Headache.    Tiredness.    Cough.    Sore throat.     Runny or stuffy nose.     Body aches.     Diarrhea and vomiting  These symptoms are referred to as "flu-like symptoms." A lot of different illnesses, including the common cold, may have similar symptoms. DIAGNOSIS    There are tests that can tell if you have the H1N1 virus.     Confirmed cases of H1N1 will be reported to the state or local health department.     A doctor's exam may be needed to tell whether you have an infection that is a complication of the flu.  HOME CARE INSTRUCTIONS    Stay informed. Visit the [facility] website for current recommendations. Visit [employer]. You may also call 1-800-CDC-INFO (352-813-9249).     Get help early if you develop any of the above symptoms.     If you are at high risk from complications of the flu, talk to your caregiver as soon as you develop flu-like symptoms. Those at higher risk for complications include:     People 65 years or older.     People with chronic medical conditions.     Pregnant women.     Young children.     Your caregiver may recommend antiviral medicine to help treat the flu.     If you get the flu, get plenty of rest, drink enough water and fluids to  keep your urine clear or pale yellow, and avoid using alcohol or tobacco.     You may take over-the-counter medicine to relieve the symptoms of the flu if your caregiver approves. (Never give aspirin to children or teenagers who have flu-like symptoms, particularly fever).  TREATMENT   If you do get sick, antiviral drugs are available. These drugs can make your illness milder and make you feel better faster. Treatment should start soon after illness starts. It is only effective if taken within the first day of becoming ill. Only your caregiver can prescribe antiviral medication.   PREVENTION    Cover your nose and mouth with a tissue or your arm when you cough or sneeze. Throw the tissue away.     Wash your hands often with soap and warm water, especially after you cough or sneeze. Alcohol-based cleaners are also effective against germs.     Avoid touching your eyes, nose or mouth. This is one way germs spread.     Try to avoid contact with sick people. Follow public health advice regarding school closures. Avoid crowds.     Stay home if you get sick. Limit contact with others to keep from infecting them. People infected with the H1N1 virus may be able to infect others anywhere from 1  day before feeling sick to 5-7 days after getting flu symptoms.     An H1N1 vaccine is available to help protect against the virus. In addition to the H1N1 vaccine, you will need to be vaccinated for seasonal influenza. The H1N1 and seasonal vaccines may be given on the same day. The CDC especially recommends the H1N1 vaccine for:     Pregnant women.     People who live with or care for children younger than 13 months of age.     Health care and emergency services personnel.     Persons between the ages of 3 months through 40 years of age.     People from ages 66 through 43 years who are at higher risk for H1N1 because of chronic health disorders or immune system problems.  FACEMASKS In community and home  settings, the use of facemasks and N95 respirators are not normally recommended. In certain circumstances, a facemask or N95 respirator may be used for persons at increased risk of severe illness from influenza. Your caregiver can give additional recommendations for facemask use. IN CHILDREN, EMERGENCY WARNING SIGNS THAT NEED URGENT MEDICAL CARE:  Fast breathing or trouble breathing.     Bluish skin color.     Not drinking enough fluids.     Not waking up or not interacting normally.     Being so fussy that the child does not want to be held.     Your child has an oral temperature above 102 F (38.9 C), not controlled by medicine.     Your baby is older than 3 months with a rectal temperature of 102 F (38.9 C) or higher.     Your baby is 75 months old or younger with a rectal temperature of 100.4 F (38 C) or higher.     Flu-like symptoms improve but then return with fever and worse cough.  IN ADULTS, EMERGENCY WARNING SIGNS THAT NEED URGENT MEDICAL CARE:  Difficulty breathing or shortness of breath.     Pain or pressure in the chest or abdomen.     Sudden dizziness.     Confusion.    Severe or persistent vomiting.     Bluish color.     You have a oral temperature above 102 F (38.9 C), not controlled by medicine.     Flu-like symptoms improve but return with fever and worse cough.  SEEK IMMEDIATE MEDICAL CARE IF:   You or someone you know is experiencing any of the above symptoms. When you arrive at the emergency center, report that you think you have the flu. You may be asked to wear a mask and/or sit in a secluded area to protect others from getting sick. MAKE SURE YOU:    Understand these instructions.     Will watch your condition.     Will get help right away if you are not doing well or get worse.  Some of this information courtesy of the CDC.  Document Released: 07/11/2007 Document Revised: 10/04/2010 Document Reviewed: 07/11/2007 [facility] Patient  Information 2012 [place], [place].

## 2011-04-08 ENCOUNTER — Emergency Department
Admission: EM | Admit: 2011-04-08 | Discharge: 2011-04-08 | Disposition: A | Discharge status: Home / Self Care | Payer: 59 | Source: Home / Self Care | Attending: Family Medicine | Admitting: Family Medicine

## 2011-04-08 ENCOUNTER — Emergency Department: Admit: 2011-04-08 | Discharge: 2011-04-08 | Disposition: A | Discharge status: Home / Self Care | Payer: 59

## 2011-04-08 DIAGNOSIS — S82111A Displaced fracture of right tibial spine, initial encounter for closed fracture: Secondary | ICD-10-CM

## 2011-04-08 DIAGNOSIS — S82109A Unspecified fracture of upper end of unspecified tibia, initial encounter for closed fracture: Secondary | ICD-10-CM

## 2011-04-08 MED ORDER — KETOROLAC TROMETHAMINE 60 MG/2ML IM SOLN
60.0000 mg | Freq: Once | INTRAMUSCULAR | Status: AC
  Administered 2011-04-08: 60 mg via INTRAMUSCULAR

## 2011-04-08 MED ORDER — HYDROCODONE-ACETAMINOPHEN 5-500 MG PO TABS: 1.0000 | ORAL_TABLET | ORAL | Status: AC | PRN

## 2011-04-08 NOTE — Discharge Instructions (Signed)
Apply ice pacKnee Fracture, Adult A knee fracture is a break in any of the bones of the lower part of the thigh bone, the upper part of the bones of the lower leg, or of the kneecap. When the bones no longer meet the way they are supposed to it is called a dislocation. Sometimes there can be a dislocation along with fractures. SYMPTOMS  Symptoms may include pain, swelling, inability to bend the knee, deformity of the knee, and inability to walk.  DIAGNOSIS  This problem is usually diagnosed with x-rays. Special studies are sometimes done if a fracture is suspected but cannot be seen on ordinary x-rays. If vessels around the knee are injured, special tests may be done to see what the damage is. TREATMENT   The leg is usually splinted for the first couple of days to allow for swelling. After the swelling is down a cast is put on. Sometimes a cast is put on right away with the sides of the cast cut to allow the knee to swell. If the bones are in place, this may be all that is needed.   If the bones are out of place, medications for pain are given to allow them to be put back in place. If they are seriously out of place, surgery may be needed to hold the pieces or breaks in place using wires, pins, screws or metal plates.   Generally most fractures will heal in 4 to 6 weeks.  HOME CARE INSTRUCTIONS   Use your crutches as directed.   To lessen the swelling, keep the injured leg elevated while sitting or lying down.   Apply ice to the injury for 15 to 20 minutes, 3 to 4 times per day while awake for 2 days. Put the ice in a plastic bag and place a thin towel between the bag of ice and your cast.   If you have a plaster or fiberglass cast:   Do not try to scratch the skin under the cast using sharp or pointed objects.   Check the skin around the cast every day. You may put lotion on any red or sore areas.   Keep your cast dry and clean.   If you have a plaster splint:   Wear the splint as  directed.   You may loosen the elastic around the splint if your toes become numb, tingle, or turn cold or blue.   Do not put pressure on any part of your cast or splint; it may break. Rest your cast only on a pillow the first 24 hours until it is fully hardened.   Your cast or splint can be protected during bathing with a plastic bag. Do not lower the cast or splint into water.   Only take over-the-counter or prescription medicines for pain, discomfort, or fever as directed by your caregiver.   See your caregiver soon if your cast gets damaged or breaks.   It is very important to keep all follow up appointments. Not following up as directed may result in a worsening of your condition or a failure of the fracture to heal properly.  SEEK IMMEDIATE MEDICAL CARE IF:  You have continued severe pain.   You have more swelling than you did before the cast was put on.   The area below the fracture becomes painful.   Your skin or toenails below the injury turn blue or gray, or feel cold or numb.   There is drainage coming from under the cast.  New, unexplained symptoms develop (drugs used in treatment may produce side effects).  MAKE SURE YOU:   Understand these instructions.   Will watch your condition.   Will get help right away if you are not doing well or get worse.  Document Released: 12/05/2005 Document Revised: 01/11/2011 Document Reviewed: 01/06/2007 [employer] Patient Information 2012 [employer].k for 30 to 45 minutes every 1 to 4 hours.  Continue until swelling decreases.  Wear ace wrap and knee immobilizer.  Avoid weight bearing (use crutches).  May take one or two Aleve tabs with food every 12 hours.

## 2011-04-08 NOTE — ED Notes (Signed)
Patient states he injured his right knee yesterday when he was jumping on a trampoline. He heard it something pop and then was unable to put pressure on his knee. He has a history of right knee surgery in 2000. He had 6 tears of his meniscus. He did apply heat and cold to his right knee and took tylenol for the pain.

## 2011-04-08 NOTE — ED Provider Notes (Signed)
History     CSN: [id number]  Arrival date & time 04/08/11  1148   First MD Initiated Contact with Patient 04/08/11 1245      Chief Complaint  Patient presents with  . Knee Injury    x yesterday      HPI Comments: Patient was jumping on a trampoline yesterday when he felt and heard a popping sensation in his right knee.  Since then he has had right knee tightness and pain with weight bearing.  He has had arthroscopic repair of his right meniscus in 2000.  Patient is a 41 y.o. male presenting with knee pain. The history is provided by the patient.  Knee Pain This is a new problem. The current episode started yesterday. The problem occurs constantly. The problem has not changed since onset.The symptoms are aggravated by standing and walking. The symptoms are relieved by nothing. Treatments tried: ice packs. The treatment provided no relief.    History reviewed. No pertinent past medical history.  Past Surgical History  Procedure Date  . Knee surgery     right knee meniscu tear    Family History  Problem Relation Age of Onset  . Hypertension Father     History  Substance Use Topics  . Smoking status: Former Smoker -- 1.0 packs/day for 13 years  . Smokeless tobacco: Never Used  . Alcohol Use: No      Review of Systems  All other systems reviewed and are negative.    Allergies  Review of patient's allergies indicates no known allergies.  Home Medications   Current Outpatient Rx  Name Route Sig Dispense Refill  . ALBUTEROL SULFATE 0.63 MG/3ML IN NEBU Nebulization Take 1 ampule by nebulization every 6 (six) hours as needed.      . ALBUTEROL SULFATE HFA 108 (90 BASE) MCG/ACT IN AERS Inhalation Inhale 2 puffs into the lungs every 6 (six) hours as needed.      . DEXLANSOPRAZOLE 60 MG PO CPDR Oral Take 1 capsule (60 mg total) by mouth daily. 90 capsule 1  . FEXOFENADINE-PSEUDOEPHED ER 180-240 MG PO TB24 Oral Take 1 tablet by mouth daily.      Marland Kitchen FLUTICASONE PROPIONATE   HFA 110 MCG/ACT IN AERO Inhalation Inhale 1 puff into the lungs 2 (two) times daily.      Marland Kitchen HYDROCODONE-ACETAMINOPHEN 5-500 MG PO TABS Oral Take 1 tablet by mouth every 4 (four) hours as needed for pain. 15 tablet 0    BP 118/70  Pulse 77  Temp(Src) 98.1 F (36.7 C) (Oral)  Resp 17  Ht 5\' 9"  (1.753 m)  Wt 192 lb (87.091 kg)  BMI 28.35 kg/m2  SpO2 96%  Physical Exam  Nursing note and vitals reviewed. Constitutional: He appears well-developed and well-nourished. No distress.  Musculoskeletal:       Right knee: He exhibits decreased range of motion, swelling and effusion. He exhibits no ecchymosis, no deformity, no laceration, no erythema, normal alignment, no LCL laxity, normal patellar mobility, no bony tenderness and no MCL laxity. tenderness found. No medial joint line, no lateral joint line, no MCL, no LCL and no patellar tendon tenderness noted.       Right knee has decreased range of motion.  Drawer test is positive for ACL disruption.  Apley's grind test is somewhat positive for meniscus injury.  Distal Neurovascular function is intact.     ED Course  Procedures none  Labs Reviewed - No data to display Dg Knee Complete 4 Views Right  04/08/2011  *RADIOLOGY REPORT*  Clinical Data: Trampoline injury, pain  RIGHT KNEE - COMPLETE 4+ VIEW  Comparison: None.  Findings: Moderate effusion in the suprapatellar bursa. Probable avulsion fracture of the tibial spine, minimally-displaced.  Normal alignment and mineralization. No significant osseous degenerative change.  IMPRESSION:  1.  Effusion with probable tibial spine avulsion fracture. Elective knee MR may be useful to further assess internal derangement.  Original Report Authenticated By: Thora Lance III, M.D.     1. Displaced fracture of right tibial spine;  Suspect right ACL disruption       MDM  Ace wrap and knee immobilizer applied.  Dispensed crutches.  Rx for Lortab given Apply ice pack for 30 to 45 minutes every 1 to 4  hours.  Continue until swelling decreases.  Wear ace wrap and knee immobilizer.  Avoid weight bearing (use crutches).  May take one or two Aleve tabs with food every 12 hours. Followup with orthopedist        Donna Christen, MD 04/09/11 2340

## 2011-04-09 ENCOUNTER — Telehealth: Payer: Self-pay | Admitting: *Deleted

## 2011-04-23 ENCOUNTER — Ambulatory Visit: Payer: 59 | Attending: Sports Medicine | Admitting: Physical Therapy

## 2011-04-23 DIAGNOSIS — R262 Difficulty in walking, not elsewhere classified: Secondary | ICD-10-CM | POA: Insufficient documentation

## 2011-04-23 DIAGNOSIS — M25669 Stiffness of unspecified knee, not elsewhere classified: Secondary | ICD-10-CM | POA: Insufficient documentation

## 2011-04-23 DIAGNOSIS — IMO0001 Reserved for inherently not codable concepts without codable children: Secondary | ICD-10-CM | POA: Insufficient documentation

## 2011-04-23 DIAGNOSIS — M25569 Pain in unspecified knee: Secondary | ICD-10-CM | POA: Insufficient documentation

## 2011-04-26 ENCOUNTER — Ambulatory Visit: Payer: 59 | Admitting: Physical Therapy

## 2011-04-30 ENCOUNTER — Encounter: Payer: 59 | Admitting: Physical Therapy

## 2011-05-03 ENCOUNTER — Encounter: Payer: 59 | Admitting: Physical Therapy

## 2011-05-07 ENCOUNTER — Encounter: Payer: 59 | Admitting: Physical Therapy

## 2011-05-10 ENCOUNTER — Encounter: Payer: 59 | Admitting: Physical Therapy

## 2011-08-27 ENCOUNTER — Emergency Department
Admission: EM | Admit: 2011-08-27 | Discharge: 2011-08-27 | Disposition: A | Discharge status: Home / Self Care | Payer: 59 | Source: Home / Self Care | Attending: Family Medicine | Admitting: Family Medicine

## 2011-08-27 ENCOUNTER — Encounter: Payer: Self-pay | Admitting: *Deleted

## 2011-08-27 DIAGNOSIS — J45909 Unspecified asthma, uncomplicated: Secondary | ICD-10-CM

## 2011-08-27 DIAGNOSIS — J029 Acute pharyngitis, unspecified: Secondary | ICD-10-CM

## 2011-08-27 LAB — POCT RAPID STREP A (OFFICE): Rapid Strep A Screen: NEGATIVE

## 2011-08-27 MED ORDER — CEFDINIR 300 MG PO CAPS: 300.0000 mg | ORAL_CAPSULE | Freq: Two times a day (BID) | ORAL | Status: AC

## 2011-08-27 MED ORDER — MOMETASONE FURO-FORMOTEROL FUM 100-5 MCG/ACT IN AERO: 2.0000 | INHALATION_SPRAY | Freq: Two times a day (BID) | RESPIRATORY_TRACT | Status: DC

## 2011-08-27 MED ORDER — PREDNISONE 20 MG PO TABS: 20.0000 mg | ORAL_TABLET | Freq: Two times a day (BID) | ORAL | Status: AC

## 2011-08-27 MED ORDER — BENZONATATE 200 MG PO CAPS: 200.0000 mg | ORAL_CAPSULE | Freq: Every day | ORAL | Status: AC

## 2011-08-27 NOTE — ED Notes (Signed)
Pt c/o LT ear ache, cough, head congestion, cough and head pressure x 4 days. Denies fever.

## 2011-08-27 NOTE — ED Provider Notes (Signed)
History     CSN: [id number]  Arrival date & time 08/27/11  1703   First MD Initiated Contact with Patient 08/27/11 1716      Chief Complaint  Patient presents with  . Cough  . Sore Throat  . Otalgia      HPI Comments: Patient complains of approximately 4 day history of gradually progressive URI symptoms beginning with a mild sore throat (now improved), followed by progressive nasal congestion.  A cough started next.  Complains of fatigue and initial myalgias.  Cough is now worse at night and generally non-productive during the day.  There has been no pleuritic pain but he has had wheezing and shortness of breath with activity.  He now has facial pressure and left earache.  He resumed his Dulera inhaler about 3 days ago, and has been using albuterol inhaler more frequently.  He states that he no longer smokes.  The history is provided by the patient.    History reviewed:  Asthma  Past Surgical History  Procedure Date  . Knee surgery     right knee meniscu tear    Family History  Problem Relation Age of Onset  . Hypertension Father     History  Substance Use Topics  . Smoking status: Former Smoker -- 1.0 packs/day for 13 years  . Smokeless tobacco: Never Used  . Alcohol Use: No      Review of Systems + sore throat + cough No pleuritic pain + wheezing + nasal congestion + post-nasal drainage + sinus pain/pressure No itchy/red eyes ? Left earache No hemoptysis + SOB with activity No fever, + chills No nausea No vomiting No abdominal pain No diarrhea No urinary symptoms No skin rashes + fatigue + myalgias + headache   Allergies  Review of patient's allergies indicates no known allergies.  Home Medications   Current Outpatient Rx  Name Route Sig Dispense Refill  . ALBUTEROL SULFATE 0.63 MG/3ML IN NEBU Nebulization Take 1 ampule by nebulization every 6 (six) hours as needed.      . ALBUTEROL SULFATE HFA 108 (90 BASE) MCG/ACT IN AERS Inhalation Inhale  2 puffs into the lungs every 6 (six) hours as needed.      Marland Kitchen BENZONATATE 200 MG PO CAPS Oral Take 1 capsule (200 mg total) by mouth at bedtime. Take as needed for cough 12 capsule 0  . CEFDINIR 300 MG PO CAPS Oral Take 1 capsule (300 mg total) by mouth 2 (two) times daily. 20 capsule 0  . DEXLANSOPRAZOLE 60 MG PO CPDR Oral Take 1 capsule (60 mg total) by mouth daily. 90 capsule 1  . FEXOFENADINE-PSEUDOEPHED ER 180-240 MG PO TB24 Oral Take 1 tablet by mouth daily.      Marland Kitchen FLUTICASONE PROPIONATE  HFA 110 MCG/ACT IN AERO Inhalation Inhale 1 puff into the lungs 2 (two) times daily.      . MOMETASONE FURO-FORMOTEROL FUM 100-5 MCG/ACT IN AERO Inhalation Inhale 2 puffs into the lungs 2 (two) times daily. 13 g 1  . PREDNISONE 20 MG PO TABS Oral Take 1 tablet (20 mg total) by mouth 2 (two) times daily. Take with food. 10 tablet 0    BP 136/94  Pulse 89  Temp 98.1 F (36.7 C) (Oral)  Resp 18  Ht 5\' 9"  (1.753 m)  Wt 205 lb (92.987 kg)  BMI 30.27 kg/m2  SpO2 96%  Physical Exam Nursing notes and Vital Signs reviewed. Appearance:  Patient appears healthy, stated age, and in no acute distress  Eyes:  Pupils are equal, round, and reactive to light and accomodation.  Extraocular movement is intact.  Conjunctivae are not inflamed  Ears:  Canals normal.  Tympanic membranes normal.  Nose:  Mildly congested turbinates, worse on left. Left maxillary sinus tenderness is present.  Pharynx:   Minimal erythema. Neck:  Supple.  No adenopathy Lungs:   Few scattered rhonchi; no rales or wheezes.  Breath sounds are equal.  Heart:  Regular rate and rhythm without murmurs, rubs, or gallops.  Abdomen:  Nontender without masses or hepatosplenomegaly.  Bowel sounds are present.  No CVA or flank tenderness.  Extremities:  No edema.  No calf tenderness Skin:  No rash present.   ED Course  Procedures none   Labs Reviewed  POCT RAPID STREP A (OFFICE) negative      1. Asthmatic bronchitis; ? Left maxillary  sinusitis  2. Acute pharyngitis       MDM  Begin prednisone burst.  Begin Omnicef.  Continue Dulera (refill given) Prescription written for Benzonatate (Tessalon) to take at bedtime for night-time cough.  Take plain Mucinex (guaifenesin) twice daily for cough and congestion.  Increase fluid intake, rest. May use Afrin nasal spray (or generic oxymetazoline) twice daily for about 5 days.  Also recommend using saline nasal spray several times daily and saline nasal irrigation (AYR is a common brand) Stop all antihistamines for now, and other non-prescription cough/cold preparations. Continue albuterol inhaler as needed. Monitor blood pressure and follow-up with PCP if remains elevated. Follow-up with family doctor if not improving 7 to 10 days.         Lattie Haw, MD 08/27/11 [id number]

## 2011-08-29 ENCOUNTER — Telehealth: Payer: Self-pay | Admitting: Family Medicine

## 2012-01-27 ENCOUNTER — Emergency Department
Admission: EM | Admit: 2012-01-27 | Discharge: 2012-01-27 | Disposition: A | Discharge status: Home / Self Care | Payer: 59 | Source: Home / Self Care | Attending: Family Medicine | Admitting: Family Medicine

## 2012-01-27 DIAGNOSIS — J069 Acute upper respiratory infection, unspecified: Secondary | ICD-10-CM

## 2012-01-27 DIAGNOSIS — R509 Fever, unspecified: Secondary | ICD-10-CM

## 2012-01-27 DIAGNOSIS — J0100 Acute maxillary sinusitis, unspecified: Secondary | ICD-10-CM

## 2012-01-27 LAB — POCT INFLUENZA A/B: Influenza B, POC: NEGATIVE

## 2012-01-27 MED ORDER — MOMETASONE FURO-FORMOTEROL FUM 100-5 MCG/ACT IN AERO: 2.0000 | INHALATION_SPRAY | Freq: Two times a day (BID) | RESPIRATORY_TRACT | Status: DC

## 2012-01-27 MED ORDER — PREDNISONE 20 MG PO TABS: 20.0000 mg | ORAL_TABLET | Freq: Two times a day (BID) | ORAL | Status: DC

## 2012-01-27 MED ORDER — AMOXICILLIN-POT CLAVULANATE 875-125 MG PO TABS: 1.0000 | ORAL_TABLET | Freq: Two times a day (BID) | ORAL | Status: DC

## 2012-01-27 MED ORDER — BENZONATATE 200 MG PO CAPS: 200.0000 mg | ORAL_CAPSULE | Freq: Every day | ORAL | Status: DC

## 2012-01-27 NOTE — ED Provider Notes (Signed)
History     CSN: [id number]  Arrival date & time 01/27/12  1617   First MD Initiated Contact with Patient 01/27/12 1622      Chief Complaint  Patient presents with  . Nasal Congestion    x 3 days  . Fever    x 3 days      HPI Comments: Oshay complains of sweats, fever, nasal congestion and chest congestion for 3 days. He had a Flu vaccine in November.  The history is provided by the patient.    History reviewed. No pertinent past medical history.  Past Surgical History  Procedure Date  . Knee surgery     right knee meniscu tear    Family History  Problem Relation Age of Onset  . Hypertension Father     History  Substance Use Topics  . Smoking status: Former Smoker -- 1.0 packs/day for 13 years  . Smokeless tobacco: Never Used  . Alcohol Use: No      Review of Systems + sore throat + cough No pleuritic pain + wheezing + nasal congestion + post-nasal drainage + sinus pain/pressure No itchy/red eyes No earache No hemoptysis No SOB No fever, + chills/sweats No nausea No vomiting No abdominal pain No diarrhea No urinary symptoms No skin rashes No fatigue + myalgias + headache Used OTC meds without relief  Allergies  Review of patient's allergies indicates no known allergies.  Home Medications   Current Outpatient Rx  Name  Route  Sig  Dispense  Refill  . ALBUTEROL SULFATE 0.63 MG/3ML IN NEBU   Nebulization   Take 1 ampule by nebulization every 6 (six) hours as needed.           . ALBUTEROL SULFATE HFA 108 (90 BASE) MCG/ACT IN AERS   Inhalation   Inhale 2 puffs into the lungs every 6 (six) hours as needed.           . AMOXICILLIN-POT CLAVULANATE 875-125 MG PO TABS   Oral   Take 1 tablet by mouth 2 (two) times daily. Take with food   20 tablet   0   . BENZONATATE 200 MG PO CAPS   Oral   Take 1 capsule (200 mg total) by mouth at bedtime. Take as needed for cough   12 capsule   0   . DEXLANSOPRAZOLE 60 MG PO CPDR   Oral  Take 1 capsule (60 mg total) by mouth daily.   90 capsule   1   . FEXOFENADINE-PSEUDOEPHED ER 180-240 MG PO TB24   Oral   Take 1 tablet by mouth daily.           Marland Kitchen FLUTICASONE PROPIONATE  HFA 110 MCG/ACT IN AERO   Inhalation   Inhale 1 puff into the lungs 2 (two) times daily.           . MOMETASONE FURO-FORMOTEROL FUM 100-5 MCG/ACT IN AERO   Inhalation   Inhale 2 puffs into the lungs 2 (two) times daily.   13 g   1   . PREDNISONE 20 MG PO TABS   Oral   Take 1 tablet (20 mg total) by mouth 2 (two) times daily.   10 tablet   0     BP 130/86  Pulse 77  Temp 98.7 F (37.1 C) (Oral)  Resp 18  Ht 5\' 9"  (1.753 m)  Wt 202 lb (91.627 kg)  BMI 29.83 kg/m2  SpO2 96%  Physical Exam Nursing notes and Vital Signs reviewed. Appearance:  Patient appears healthy, stated age, and in no acute distress Eyes:  Pupils are equal, round, and reactive to light and accomodation.  Extraocular movement is intact.  Conjunctivae are not inflamed  Ears:  Canals normal.  Tympanic membranes normal.  Nose:  Mildly congested turbinates.  Maxillary sinus tenderness is present.  Pharynx:  Normal Neck:  Supple.  Slightly tender shotty posterior nodes are palpated bilaterally  Lungs:  Clear to auscultation.  Breath sounds are equal.  Heart:  Regular rate and rhythm without murmurs, rubs, or gallops.  Abdomen:  Nontender without masses or hepatosplenomegaly.  Bowel sounds are present.  No CVA or flank tenderness.  Extremities:  No edema.  No calf tenderness Skin:  No rash present.   ED Course  Procedures  none   Labs Reviewed  POCT INFLUENZA A/B - Normal      1. Fever   2. Acute maxillary sinusitis   3. Acute upper respiratory infections of unspecified site       MDM  Begin Augmentin and prednisone burst.  Rx written for [facility].   Prescription written for Benzonatate [facility]) to take at bedtime for night-time cough.  Take Mucinex D (guaifenesin with decongestant) twice daily for  congestion.  Increase fluid intake, rest. May use Afrin nasal spray (or generic oxymetazoline) twice daily for about 5 days.  Also recommend using saline nasal spray several times daily and saline nasal irrigation (AYR is a common brand) Stop all antihistamines for now, and other non-prescription cough/cold preparations. Continue albuterol by nebulizer Follow-up with family doctor if not improving 7 to 10 days.         Lattie Haw, MD 01/29/12 2212

## 2012-01-27 NOTE — ED Notes (Signed)
Bradley Bernard complains of sweats, fever, nasal congestion and chest congestion for 3 days. He had a Flu vaccine in November.

## 2012-03-19 ENCOUNTER — Other Ambulatory Visit: Payer: Self-pay | Admitting: Physician Assistant

## 2012-03-19 MED ORDER — OSELTAMIVIR PHOSPHATE 75 MG PO CAPS: 75.0000 mg | ORAL_CAPSULE | Freq: Every day | ORAL | Status: DC

## 2012-04-14 ENCOUNTER — Ambulatory Visit (INDEPENDENT_AMBULATORY_CARE_PROVIDER_SITE_OTHER): Payer: 59 | Admitting: Family Medicine

## 2012-04-14 ENCOUNTER — Encounter: Payer: Self-pay | Admitting: Family Medicine

## 2012-04-14 VITALS — BP 134/70 | HR 77 | Ht 69.0 in | Wt 210.0 lb

## 2012-04-14 DIAGNOSIS — J309 Allergic rhinitis, unspecified: Secondary | ICD-10-CM

## 2012-04-14 DIAGNOSIS — S83511D Sprain of anterior cruciate ligament of right knee, subsequent encounter: Secondary | ICD-10-CM

## 2012-04-14 DIAGNOSIS — Z5189 Encounter for other specified aftercare: Secondary | ICD-10-CM

## 2012-04-14 DIAGNOSIS — Z6831 Body mass index (BMI) 31.0-31.9, adult: Secondary | ICD-10-CM

## 2012-04-14 DIAGNOSIS — Z0000 Encounter for general adult medical examination without abnormal findings: Secondary | ICD-10-CM

## 2012-04-14 DIAGNOSIS — K219 Gastro-esophageal reflux disease without esophagitis: Secondary | ICD-10-CM

## 2012-04-14 LAB — POCT URINALYSIS DIPSTICK
Glucose, UA: NEGATIVE
Spec Grav, UA: 1.025
Urobilinogen, UA: 0.2
pH, UA: 6.5

## 2012-04-14 MED ORDER — PANTOPRAZOLE SODIUM 40 MG PO TBEC: 40.0000 mg | DELAYED_RELEASE_TABLET | Freq: Every day | ORAL | Status: DC

## 2012-04-14 MED ORDER — DEXLANSOPRAZOLE 60 MG PO CPDR: 60.0000 mg | DELAYED_RELEASE_CAPSULE | Freq: Every day | ORAL | Status: DC

## 2012-04-14 MED ORDER — FLUTICASONE PROPIONATE 50 MCG/ACT NA SUSP: 2.0000 | Freq: Every day | NASAL | Status: DC

## 2012-04-14 NOTE — Progress Notes (Signed)
Subjective:    Patient ID: Bradley Bernard, male    DOB: [birth year], 42 y.o.   MRN: [id number]  HPI Here for CPE today. Doing well overall. GOing to have knee surgery on the right knee for ACL repair.  Did get the flu shot this year. Due for Tdap . He is not working out regularly. He admits he's gained a lot of weight over the last year.   Review of Systems Comprehensive ROS is negative.    BP 134/70  Pulse 77  Ht 5\' 9"  (1.753 m)  Wt 210 lb (95.255 kg)  BMI 31 kg/m2    No Known Allergies  No past medical history on file.  Past Surgical History  Procedure Laterality Date  . Knee surgery      right knee meniscu tear    History   Social History  . Marital Status: Married    Spouse Name: Maxine Glenn    Number of Children: N/A  . 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 Smoking status: Former Smoker -- 1.00 packs/day for 13 years  . Smokeless tobacco: Never Used  . Alcohol Use: No  . Drug Use: No  . Sexually Active: Not on file   Other Topics Concern  . Not on file   Social History Narrative  . No narrative on file    Family History  Problem Relation Age of Onset  . Hypertension Father     Outpatient Encounter Prescriptions as of 04/14/2012  Medication Sig Dispense Refill  . albuterol (ACCUNEB) 0.63 MG/3ML nebulizer solution Take 1 ampule by nebulization every 6 (six) hours as needed.        Marland Kitchen albuterol (PROVENTIL HFA) 108 (90 BASE) MCG/ACT inhaler Inhale 2 puffs into the lungs every 6 (six) hours as needed.        Marland Kitchen dexlansoprazole (DEXILANT) 60 MG capsule Take 1 capsule (60 mg total) by mouth daily.  90 capsule  1  . fexofenadine-pseudoephedrine (ALLEGRA-D 24) 180-240 MG per 24 hr tablet Take 1 tablet by mouth daily.        . fluticasone (FLOVENT HFA) 110 MCG/ACT inhaler Inhale 1 puff into the lungs 2 (two) times daily.        . mometasone-formoterol (DULERA) 100-5 MCG/ACT AERO Inhale 2 puffs into the  lungs 2 (two) times daily.  13 g  1  . [DISCONTINUED] amoxicillin-clavulanate (AUGMENTIN) 875-125 MG per tablet Take 1 tablet by mouth 2 (two) times daily. Take with food  20 tablet  0  . [DISCONTINUED] benzonatate (TESSALON) 200 MG capsule Take 1 capsule (200 mg total) by mouth at bedtime. Take as needed for cough  12 capsule  0  . [DISCONTINUED] dexlansoprazole (DEXILANT) 60 MG capsule Take 1 capsule (60 mg total) by mouth daily.  90 capsule  1  . [DISCONTINUED] oseltamivir (TAMIFLU) 75 MG capsule Take 1 capsule (75 mg total) by mouth daily.  10 capsule  0  . [DISCONTINUED] predniSONE (DELTASONE) 20 MG tablet Take 1 tablet (20 mg total) by mouth 2 (two) times daily.  10 tablet  0   No facility-administered encounter medications on file as of 04/14/2012.          Objective:   Physical Exam  Constitutional: He is oriented to person, place, and time. He appears well-developed and well-nourished.  HENT:  Head: Normocephalic and atraumatic.  Right Ear: External ear normal.  Left Ear: External ear normal.  Nose:  Nose normal.  Mouth/Throat: Oropharynx is clear and moist.  Eyes: Conjunctivae and EOM are normal. Pupils are equal, round, and reactive to light.  Neck: Normal range of motion. Neck supple. No thyromegaly present.  Cardiovascular: Normal rate, regular rhythm, normal heart sounds and intact distal pulses.   Pulmonary/Chest: Effort normal and breath sounds normal.  Abdominal: Soft. Bowel sounds are normal. He exhibits no distension and no mass. There is no tenderness. There is no rebound and no guarding.  Musculoskeletal: Normal range of motion.  Lymphadenopathy:    He has no cervical adenopathy.  Neurological: He is alert and oriented to person, place, and time. He has normal reflexes.  Skin: Skin is warm and dry.  Psychiatric: He has a normal mood and affect. His behavior is normal. Judgment and thought content normal.          Assessment & Plan:  CPE -  Keep up a  regular exercise program and make sure you are eating a healthy diet Try to eat 4 servings of dairy a day, or if you are lactose intolerant take a calcium with vitamin D daily.  Your vaccines are up to date.  Due for screening labs. Given slip today.   Tdap given today.   Overweight-encouraged him to get back on track with his diet and exercise. He's gained 18 pounds in the last year and encouraged him to work on this.  Hyperlipidemia-due to recheck lipids.  Right anterior cruciate ligament tear-he is following up with the orthopedist to see if he needs surgery soon.  GERD-his reflux is under fairly good control. He admits that when he has some dietary indiscretion such as soda etc. that his symptoms do flare. He's been tolerating the Taxol well it does help his symptoms. He is okay with switching to generic Protonix for cost reasons. Nutrition sent to the pharmacy.  Allergic rhinitis-encouraged him to get back on his nasal steroid spray. He says he does tend to get some nasal congestion towards the end of the day. He has been using his nasal saline rinses regularly and it does seem to help. He has been out of the nasal steroid for quite some time. New prescriptions sent to pharmacy.

## 2012-04-14 NOTE — Patient Instructions (Signed)
Keep up a regular exercise program and make sure you are eating a healthy diet Try to eat 4 servings of dairy a day, or if you are lactose intolerant take a calcium with vitamin D daily.  Your vaccines are up to date.   

## 2012-04-15 LAB — LIPID PANEL
Cholesterol: 193 mg/dL (ref 0–200)
HDL: 32 mg/dL — ABNORMAL LOW (ref >39)
Triglycerides: 161 mg/dL — ABNORMAL HIGH (ref <150)
VLDL: 32 mg/dL (ref 0–40)

## 2012-04-15 LAB — COMPLETE METABOLIC PANEL WITH GFR
ALT: 46 U/L (ref 0–53)
AST: 25 U/L (ref 0–37)
Albumin: 4.1 g/dL (ref 3.5–5.2)
BUN: 16 mg/dL (ref 6–23)
Sodium: 140 mEq/L (ref 135–145)

## 2012-04-17 ENCOUNTER — Other Ambulatory Visit: Payer: Self-pay | Admitting: Family Medicine

## 2012-04-17 LAB — URINE CULTURE: Colony Count: >=100000

## 2012-04-17 MED ORDER — CIPROFLOXACIN HCL 500 MG PO TABS: 500.0000 mg | ORAL_TABLET | Freq: Two times a day (BID) | ORAL | Status: AC

## 2012-06-20 HISTORY — PX: OTHER SURGICAL HISTORY: SHX169

## 2012-10-10 ENCOUNTER — Ambulatory Visit: Payer: PRIVATE HEALTH INSURANCE | Admitting: Family Medicine

## 2012-10-11 ENCOUNTER — Emergency Department
Admission: EM | Admit: 2012-10-11 | Discharge: 2012-10-11 | Disposition: A | Discharge status: Home / Self Care | Payer: PRIVATE HEALTH INSURANCE | Source: Home / Self Care | Attending: Family Medicine | Admitting: Family Medicine

## 2012-10-11 ENCOUNTER — Encounter: Payer: Self-pay | Admitting: Emergency Medicine

## 2012-10-11 DIAGNOSIS — J069 Acute upper respiratory infection, unspecified: Secondary | ICD-10-CM

## 2012-10-11 DIAGNOSIS — R062 Wheezing: Secondary | ICD-10-CM

## 2012-10-11 HISTORY — DX: Unspecified asthma, uncomplicated: J45.909

## 2012-10-11 HISTORY — DX: Other seasonal allergic rhinitis: J30.2

## 2012-10-11 MED ORDER — METHYLPREDNISOLONE SODIUM SUCC 125 MG IJ SOLR
125.0000 mg | Freq: Once | INTRAMUSCULAR | Status: AC
  Administered 2012-10-11: 125 mg via INTRAMUSCULAR

## 2012-10-11 MED ORDER — PREDNISONE 50 MG PO TABS: ORAL_TABLET | ORAL | Status: DC

## 2012-10-11 MED ORDER — AZITHROMYCIN 250 MG PO TABS: ORAL_TABLET | ORAL | Status: DC

## 2012-10-11 NOTE — ED Notes (Signed)
Patient gives 4 day history of congestion, cough, headache, body aches, and suspected fever. Used inhaler for previous asthma dx, and allergy medication without resolution.

## 2012-10-11 NOTE — ED Provider Notes (Addendum)
CSN: [id number]     Arrival date & time 10/11/12  4098 History   None    Chief Complaint  Patient presents with  . Nasal Congestion  . Headache  . Cough    HPI  URI Symptoms Onset: 3-4 days  Description: sinus pressure, nasal congestion, cough, wheezing  Modifying factors:  Baseline asthma, increased albuterol use   Symptoms Nasal discharge: yes Fever: no Sore throat: no Cough: yes Wheezing: yes Ear pain: no GI symptoms: no Sick contacts: no  Red Flags  Stiff neck: no Dyspnea: minimal  Rash: no Swallowing difficulty: no  Sinusitis Risk Factors Headache/face pain: no Double sickening: no tooth pain: no  Allergy Risk Factors Sneezing: no Itchy scratchy throat: no Seasonal symptoms: no  Flu Risk Factors Headache: no muscle aches: no severe fatigue: no   Past Medical History  Diagnosis Date  . Seasonal allergies   . Asthma    Past Surgical History  Procedure Laterality Date  . Knee surgery      right knee meniscu tear   Family History  Problem Relation Age of Onset  . Hypertension Father   . Breast cancer Mother    History  Substance Use Topics  . Smoking status: Former Smoker -- 1.00 packs/day for 13 years  . Smokeless tobacco: Never Used  . Alcohol Use: No    Review of Systems  Allergies  Review of patient's allergies indicates no known allergies.  Home Medications   Current Outpatient Rx  Name  Route  Sig  Dispense  Refill  . albuterol (ACCUNEB) 0.63 MG/3ML nebulizer solution   Nebulization   Take 1 ampule by nebulization every 6 (six) hours as needed.           Marland Kitchen albuterol (PROVENTIL HFA) 108 (90 BASE) MCG/ACT inhaler   Inhalation   Inhale 2 puffs into the lungs every 6 (six) hours as needed.           Marland Kitchen azithromycin (ZITHROMAX) 250 MG tablet      Take 2 tabs PO x 1 dose, then 1 tab PO QD x 4 days   6 tablet   0   . fexofenadine-pseudoephedrine (ALLEGRA-D 24) 180-240 MG per 24 hr tablet   Oral   Take 1 tablet by mouth  daily.           . fluticasone (FLONASE) 50 MCG/ACT nasal spray   Nasal   Place 2 sprays into the nose daily.   16 g   12   . fluticasone (FLOVENT HFA) 110 MCG/ACT inhaler   Inhalation   Inhale 1 puff into the lungs 2 (two) times daily.           . mometasone-formoterol (DULERA) 100-5 MCG/ACT AERO   Inhalation   Inhale 2 puffs into the lungs 2 (two) times daily.   13 g   1   . pantoprazole (PROTONIX) 40 MG tablet   Oral   Take 1 tablet (40 mg total) by mouth daily.   90 tablet   1   . predniSONE (DELTASONE) 50 MG tablet      1 tab daily x 5 days   5 tablet   0    BP 117/76  Pulse 69  Temp(Src) 98.1 F (36.7 C) (Oral)  Ht 5\' 9"  (1.753 m)  Wt 196 lb (88.905 kg)  BMI 28.93 kg/m2  SpO2 98% Physical Exam  ED Course  Procedures (including critical care time) Labs Review Labs Reviewed - No data to display Imaging  Review No results found.  MDM   1. URI (upper respiratory infection)   2. Wheezing    Likely viral process Solumedrol 125 mg IM x1 for wheezing Prednisone x 5 days Z-Pak for atypical coverage if symptoms fail to improve in the next 3-5 days. Discuss infectious and respiratory flags at length with patient. Followup as needed.    The patient and/or caregiver has been counseled thoroughly with regard to treatment plan and/or medications prescribed including dosage, schedule, interactions, rationale for use, and possible side effects and they verbalize understanding. Diagnoses and expected course of recovery discussed and will return if not improved as expected or if the condition worsens. Patient and/or caregiver verbalized understanding.         Doree Albee, MD 10/11/12 4098  Doree Albee, MD 11/13/12 [id number]

## 2012-10-13 ENCOUNTER — Ambulatory Visit: Payer: PRIVATE HEALTH INSURANCE | Admitting: Family Medicine

## 2013-04-21 ENCOUNTER — Encounter: Payer: Self-pay | Admitting: Sports Medicine

## 2013-04-21 ENCOUNTER — Ambulatory Visit (INDEPENDENT_AMBULATORY_CARE_PROVIDER_SITE_OTHER): Payer: PRIVATE HEALTH INSURANCE | Admitting: Sports Medicine

## 2013-04-21 VITALS — BP 120/74 | HR 72 | Ht 69.0 in | Wt 195.0 lb

## 2013-04-21 DIAGNOSIS — J0100 Acute maxillary sinusitis, unspecified: Secondary | ICD-10-CM

## 2013-04-21 MED ORDER — AMOXICILLIN-POT CLAVULANATE 875-125 MG PO TABS: 1.0000 | ORAL_TABLET | Freq: Two times a day (BID) | ORAL | Status: DC

## 2013-04-21 MED ORDER — FLUTICASONE PROPIONATE 50 MCG/ACT NA SUSP: NASAL | Status: DC

## 2013-04-21 NOTE — Patient Instructions (Signed)

## 2013-04-21 NOTE — Progress Notes (Signed)
  Subjective:    CC: Cough  HPI: Patient is a pleasant 43 yo male with a 5 day history of sinus pain and cough. He has nasal congestion, runny nose, cough and sinus pain. He has also been having some fever and chills, muscle aches and some diarrhea over the weekend. The headache, sinus pain and congestion is moderate to severe. Denies nausea or vomiting.   Past medical history, Surgical history, Family history not pertinant except as noted below, Social history, Allergies, and medications have been entered into the medical record, reviewed, and no changes needed.   Review of Systems: No fevers, chills, night sweats, weight loss, chest pain, or shortness of breath.   Objective:    General: Well Developed, well nourished, and in no acute distress.  Neuro: Alert and oriented x3, extra-ocular muscles intact, sensation grossly intact.  HEENT: Normocephalic, atraumatic, pupils equal round reactive to light, neck supple, no masses, mild lymphadenopathy in the left cervical nodes, thyroid nonpalpable. Maxillary sinuses very tender to palpation. Skin: Warm and dry, no rashes. Cardiac: Regular rate and rhythm, no murmurs rubs or gallops, no lower extremity edema.  Respiratory: Clear to auscultation bilaterally. Not using accessory muscles, speaking in full sentences.  Impression and Recommendations:

## 2013-04-21 NOTE — Assessment & Plan Note (Signed)
Flonase, Augmentin. Return to work on Thursday.

## 2013-05-15 ENCOUNTER — Encounter: Payer: Self-pay | Admitting: Physician Assistant

## 2013-05-15 ENCOUNTER — Telehealth: Payer: Self-pay | Admitting: *Deleted

## 2013-05-15 ENCOUNTER — Ambulatory Visit (INDEPENDENT_AMBULATORY_CARE_PROVIDER_SITE_OTHER): Payer: PRIVATE HEALTH INSURANCE | Admitting: Physician Assistant

## 2013-05-15 VITALS — BP 121/68 | HR 76 | Temp 98.1°F | Wt 198.0 lb

## 2013-05-15 DIAGNOSIS — J45901 Unspecified asthma with (acute) exacerbation: Secondary | ICD-10-CM

## 2013-05-15 MED ORDER — HYDROCODONE-HOMATROPINE 5-1.5 MG/5ML PO SYRP: 5.0000 mL | ORAL_SOLUTION | Freq: Every evening | ORAL | Status: DC | PRN

## 2013-05-15 MED ORDER — PREDNISONE 50 MG PO TABS: ORAL_TABLET | ORAL | Status: DC

## 2013-05-15 MED ORDER — AZITHROMYCIN 250 MG PO TABS: ORAL_TABLET | ORAL | Status: DC

## 2013-05-15 NOTE — Telephone Encounter (Signed)
[facility] called wanting to know if you just wanted to give the pt a z-pack.  You wrote to take for 5 days but sent 6 pills. Please clarify.

## 2013-05-15 NOTE — Telephone Encounter (Signed)
Please call pharmacy dose is 2 tabs today and one tab for 4 days. That is the zpak must have been put in wrong.

## 2013-05-15 NOTE — Patient Instructions (Signed)
ZpaK.  Prednisone for 5 days.  Hycodan for cough.  Continue albuterol nebulizer every 2-4 hours.

## 2013-05-18 NOTE — Telephone Encounter (Signed)
Spoke with pharm & pt had already picked up rx.

## 2013-05-18 NOTE — Progress Notes (Signed)
   Subjective:    Patient ID: Bradley Bernard, male    DOB: July 31, 1970, 43 y.o.   MRN: [id number]  HPI Pt presents to the clinic the cough for 3 days. His wife has been sick with sinus infection/URI. Pt has asthma. Using albuterol inhaler 2-3 times a day. Cough is productive with green sputum. Does feel tight in chest and SOB. Eyes watery and itchy. No fever, chills, nausea vomiting diarrhea. Some sinus pressure. Wheezing last night was the worse. Could not lay down due to cough and wheezing.    Review of Systems     Objective:   Physical Exam  Constitutional: He is oriented to person, place, and time. He appears well-developed and well-nourished.  HENT:  Head: Normocephalic and atraumatic.  Right Ear: External ear normal.  Left Ear: External ear normal.  Nose: Nose normal.  Mouth/Throat: Oropharynx is clear and moist. No oropharyngeal exudate.  Eyes: Conjunctivae are normal. Right eye exhibits no discharge. Left eye exhibits no discharge.  Neck: Normal range of motion. Neck supple.  Cardiovascular: Normal rate, regular rhythm and normal heart sounds.   Pulmonary/Chest: Effort normal and breath sounds normal.  Coarse breath sounds. No active wheezing. Pt had just used bronchodilator before office visit.   Lymphadenopathy:    He has no cervical adenopathy.  Neurological: He is alert and oriented to person, place, and time.  Skin: Skin is dry.  Psychiatric: He has a normal mood and affect. His behavior is normal.          Assessment & Plan:  Asthmatic bronchitis with acute asthma exacerbation- ZpaK. Prednisone for 5 days. Hycodan for cough. Pt has nebulizer at home. Continue albuterol nebulizer/inhaler every 2-4 hours. Continue dulera/flonase/allegra for maintence.

## 2013-08-17 ENCOUNTER — Encounter: Payer: Self-pay | Admitting: Family Medicine

## 2013-08-17 ENCOUNTER — Ambulatory Visit (INDEPENDENT_AMBULATORY_CARE_PROVIDER_SITE_OTHER): Payer: PRIVATE HEALTH INSURANCE | Admitting: Family Medicine

## 2013-08-17 VITALS — BP 146/88 | HR 95 | Wt 189.0 lb

## 2013-08-17 DIAGNOSIS — R300 Dysuria: Secondary | ICD-10-CM

## 2013-08-17 DIAGNOSIS — N453 Epididymo-orchitis: Secondary | ICD-10-CM

## 2013-08-17 DIAGNOSIS — N451 Epididymitis: Secondary | ICD-10-CM

## 2013-08-17 DIAGNOSIS — E785 Hyperlipidemia, unspecified: Secondary | ICD-10-CM

## 2013-08-17 LAB — POCT URINALYSIS DIPSTICK
BILIRUBIN UA: NEGATIVE
Glucose, UA: NEGATIVE
Ketones, UA: NEGATIVE
Leukocytes, UA: NEGATIVE
NITRITE UA: NEGATIVE
Spec Grav, UA: 1.025
Urobilinogen, UA: 0.2
pH, UA: 7

## 2013-08-17 MED ORDER — CEFTRIAXONE SODIUM 250 MG IJ SOLR
250.0000 mg | Freq: Once | INTRAMUSCULAR | Status: AC
  Administered 2013-08-17: 250 mg via INTRAMUSCULAR

## 2013-08-17 MED ORDER — OXYCODONE-ACETAMINOPHEN 5-325 MG PO TABS: 1.0000 | ORAL_TABLET | Freq: Four times a day (QID) | ORAL | Status: DC | PRN

## 2013-08-17 MED ORDER — CEFTRIAXONE SODIUM 250 MG IJ SOLR: 250.0000 mg | Freq: Once | INTRAMUSCULAR | Status: DC

## 2013-08-17 MED ORDER — DOXYCYCLINE HYCLATE 100 MG PO TABS: 100.0000 mg | ORAL_TABLET | Freq: Two times a day (BID) | ORAL | Status: DC

## 2013-08-17 NOTE — Progress Notes (Signed)
   Subjective:    Patient ID: Bradley Bernard, male    DOB: 02-Jan-1971, 43 y.o.   MRN: [id number]  HPI Had a vasectomy in 2003.  Had had several infections in the "tubes".  Has appt at [facility].  Semen in more yellow color than previous.  Pain in the pelvic area bilaterally and pain into the testicles.  Has had some cold sweats.  No fever.  No meds.  Some low back pain, bilaterally. His sxs started with dysuria about 2 days and now has developed pain in the groin and testicles. Says feels very much like previous infections.  Has had 4 since his vasectomy. Last one being a few years ago.  He has not taken any anti-inflammatories. No trauma or injury.   Review of Systems     Objective:   Physical Exam  Constitutional: He appears well-developed and well-nourished.  HENT:  Head: Normocephalic and atraumatic.  Genitourinary:  No hernia on exam. Uncircumcised.  Does have significant tenderness and swelling over the left epididymis. No pain over the testicles themselves.  Skin: Skin is warm and dry.  Psychiatric: He has a normal mood and affect.          Assessment & Plan:  Left epididymitis - Will tx with rocephin IM and oral doxy. Call if not better towards the end of the week.  Keep appointment with urology which is scheduled in August. We'll give him a small quantity of oxycodone to use when necessary for pain. Also recommend Aleve or ibuprofen for inflammation. Urinalysis did show trace protein and blood. We'll send for culture for further evaluation. If suddenly gets worse eval for torsed testicle etc.

## 2013-08-18 ENCOUNTER — Telehealth: Payer: Self-pay | Admitting: *Deleted

## 2013-08-18 LAB — URINE CULTURE
COLONY COUNT: NO GROWTH
ORGANISM ID, BACTERIA: NO GROWTH

## 2013-08-18 MED ORDER — CIPROFLOXACIN HCL 500 MG PO TABS: 500.0000 mg | ORAL_TABLET | Freq: Two times a day (BID) | ORAL | Status: AC

## 2013-08-18 NOTE — Telephone Encounter (Signed)
Ok will send over Cipro.

## 2013-08-18 NOTE — Telephone Encounter (Signed)
Pt informed.Bradley Bernard Bradley Bernard  

## 2013-08-18 NOTE — Telephone Encounter (Signed)
Called pharmacy pt stated that his Abx is too expensive ($30 with insurance) and would like to know if something else can be sent.Bradley Bernard [place]

## 2013-08-20 ENCOUNTER — Encounter: Payer: Self-pay | Admitting: *Deleted

## 2013-08-20 LAB — COMPLETE METABOLIC PANEL WITH GFR
ALK PHOS: 61 U/L (ref 39–117)
ALT: 20 U/L (ref 0–53)
AST: 15 U/L (ref 0–37)
Albumin: 3.8 g/dL (ref 3.5–5.2)
BUN: 13 mg/dL (ref 6–23)
CO2: 25 meq/L (ref 19–32)
CREATININE: 1.03 mg/dL (ref 0.50–1.35)
Calcium: 8.9 mg/dL (ref 8.4–10.5)
Chloride: 105 mEq/L (ref 96–112)
GFR, EST NON AFRICAN AMERICAN: 88 mL/min
Glucose, Bld: 93 mg/dL (ref 70–99)
Potassium: 4.4 mEq/L (ref 3.5–5.3)
Sodium: 138 mEq/L (ref 135–145)
Total Bilirubin: 0.3 mg/dL (ref 0.2–1.2)
Total Protein: 6.8 g/dL (ref 6.0–8.3)

## 2013-08-20 NOTE — Progress Notes (Signed)
Quick Note:  All labs are normal. ______ 

## 2013-10-29 ENCOUNTER — Ambulatory Visit (INDEPENDENT_AMBULATORY_CARE_PROVIDER_SITE_OTHER): Payer: PRIVATE HEALTH INSURANCE | Admitting: Family Medicine

## 2013-10-29 VITALS — Temp 98.7°F

## 2013-10-29 DIAGNOSIS — Z23 Encounter for immunization: Secondary | ICD-10-CM

## 2013-10-29 NOTE — Progress Notes (Signed)
Pt here to get flu vaccination he is afebrile. Pt tolerated injection well.Audelia Hives [place]

## 2014-04-12 ENCOUNTER — Ambulatory Visit (INDEPENDENT_AMBULATORY_CARE_PROVIDER_SITE_OTHER): Payer: Self-pay | Admitting: Physician Assistant

## 2014-04-12 ENCOUNTER — Encounter: Payer: Self-pay | Admitting: Physician Assistant

## 2014-04-12 ENCOUNTER — Ambulatory Visit (INDEPENDENT_AMBULATORY_CARE_PROVIDER_SITE_OTHER): Payer: 59

## 2014-04-12 DIAGNOSIS — M25511 Pain in right shoulder: Secondary | ICD-10-CM

## 2014-04-12 DIAGNOSIS — M545 Low back pain: Secondary | ICD-10-CM

## 2014-04-12 DIAGNOSIS — M5442 Lumbago with sciatica, left side: Secondary | ICD-10-CM

## 2014-04-12 MED ORDER — HYDROCODONE-ACETAMINOPHEN 5-325 MG PO TABS: 1.0000 | ORAL_TABLET | Freq: Three times a day (TID) | ORAL | Status: DC | PRN

## 2014-04-12 MED ORDER — CYCLOBENZAPRINE HCL 10 MG PO TABS: 10.0000 mg | ORAL_TABLET | Freq: Three times a day (TID) | ORAL | Status: DC | PRN

## 2014-04-12 MED ORDER — KETOROLAC TROMETHAMINE 60 MG/2ML IM SOLN
60.0000 mg | Freq: Once | INTRAMUSCULAR | Status: AC
  Administered 2014-04-12: 60 mg via INTRAMUSCULAR

## 2014-04-12 MED ORDER — MELOXICAM 15 MG PO TABS: 15.0000 mg | ORAL_TABLET | Freq: Every day | ORAL | Status: DC
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
## 2014-04-12 NOTE — Patient Instructions (Signed)
Get xrays.  Shot of toradol today.  mobic daily starting tomorrow.  Flexeril 3 times a day for next couple of days.  Ice 105minutes.   Low Back Strain with Rehab A strain is an injury in which a tendon or muscle is torn. The muscles and tendons of the lower back are vulnerable to strains. However, these muscles and tendons are very strong and require a great force to be injured. Strains are classified into three categories. Grade 1 strains cause pain, but the tendon is not lengthened. Grade 2 strains include a lengthened ligament, due to the ligament being stretched or partially ruptured. With grade 2 strains there is still function, although the function may be decreased. Grade 3 strains involve a complete tear of the tendon or muscle, and function is usually impaired. SYMPTOMS   Pain in the lower back.  Pain that affects one side more than the other.  Pain that gets worse with movement and may be felt in the hip, buttocks, or back of the thigh.  Muscle spasms of the muscles in the back.  Swelling along the muscles of the back.  Loss of strength of the back muscles.  Crackling sound (crepitation) when the muscles are touched. CAUSES  Lower back strains occur when a force is placed on the muscles or tendons that is greater than they can handle. Common causes of injury include:  Prolonged overuse of the muscle-tendon units in the lower back, usually from incorrect posture.  A single violent injury or force applied to the back. RISK INCREASES WITH:  Sports that involve twisting forces on the spine or a lot of bending at the waist (football, rugby, weightlifting, bowling, golf, tennis, speed skating, racquetball, swimming, running, gymnastics, diving).  Poor strength and flexibility.  Failure to warm up properly before activity.  Family history of lower back pain or disk disorders.  Previous back injury or surgery (especially fusion).  Poor posture with lifting, especially heavy  objects.  Prolonged sitting, especially with poor posture. PREVENTION   Learn and use proper posture when sitting or lifting (maintain proper posture when sitting, lift using the knees and legs, not at the waist).  Warm up and stretch properly before activity.  Allow for adequate recovery between workouts.  Maintain physical fitness:  Strength, flexibility, and endurance.  Cardiovascular fitness. PROGNOSIS  If treated properly, lower back strains usually heal within 6 weeks. RELATED COMPLICATIONS   Recurring symptoms, resulting in a chronic problem.  Chronic inflammation, scarring, and partial muscle-tendon tear.  Delayed healing or resolution of symptoms.  Prolonged disability. TREATMENT  Treatment first involves the use of ice and medicine, to reduce pain and inflammation. The use of strengthening and stretching exercises may help reduce pain with activity. These exercises may be performed at home or with a therapist. Severe injuries may require referral to a therapist for further evaluation and treatment, such as ultrasound. Your caregiver may advise that you wear a back brace or corset, to help reduce pain and discomfort. Often, prolonged bed rest results in greater harm then benefit. Corticosteroid injections may be recommended. However, these should be reserved for the most serious cases. It is important to avoid using your back when lifting objects. At night, sleep on your back on a firm mattress with a pillow placed under your knees. If non-surgical treatment is unsuccessful, surgery may be needed.  MEDICATION   If pain medicine is needed, nonsteroidal anti-inflammatory medicines (aspirin and ibuprofen), or other minor pain relievers (acetaminophen), are often advised.  Do not take pain medicine for 7 days before surgery.  Prescription pain relievers may be given, if your caregiver thinks they are needed. Use only as directed and only as much as you need.  Ointments  applied to the skin may be helpful.  Corticosteroid injections may be given by your caregiver. These injections should be reserved for the most serious cases, because they may only be given a certain number of times. HEAT AND COLD  Cold treatment (icing) should be applied for 10 to 15 minutes every 2 to 3 hours for inflammation and pain, and immediately after activity that aggravates your symptoms. Use ice packs or an ice massage.  Heat treatment may be used before performing stretching and strengthening activities prescribed by your caregiver, physical therapist, or athletic trainer. Use a heat pack or a warm water soak. SEEK MEDICAL CARE IF:   Symptoms get worse or do not improve in 2 to 4 weeks, despite treatment.  You develop numbness, weakness, or loss of bowel or bladder function.  New, unexplained symptoms develop. (Drugs used in treatment may produce side effects.) EXERCISES  RANGE OF MOTION (ROM) AND STRETCHING EXERCISES - Low Back Strain Most people with lower back pain will find that their symptoms get worse with excessive bending forward (flexion) or arching at the lower back (extension). The exercises which will help resolve your symptoms will focus on the opposite motion.  Your physician, physical therapist or athletic trainer will help you determine which exercises will be most helpful to resolve your lower back pain. Do not complete any exercises without first consulting with your caregiver. Discontinue any exercises which make your symptoms worse until you speak to your caregiver.  If you have pain, numbness or tingling which travels down into your buttocks, leg or foot, the goal of the therapy is for these symptoms to move closer to your back and eventually resolve. Sometimes, these leg symptoms will get better, but your lower back pain may worsen. This is typically an indication of progress in your rehabilitation. Be very alert to any changes in your symptoms and the activities  in which you participated in the 24 hours prior to the change. Sharing this information with your caregiver will allow him/her to most efficiently treat your condition.  These exercises may help you when beginning to rehabilitate your injury. Your symptoms may resolve with or without further involvement from your physician, physical therapist or athletic trainer. While completing these exercises, remember:  Restoring tissue flexibility helps normal motion to return to the joints. This allows healthier, less painful movement and activity.  An effective stretch should be held for at least 30 seconds.  A stretch should never be painful. You should only feel a gentle lengthening or release in the stretched tissue. FLEXION RANGE OF MOTION AND STRETCHING EXERCISES: STRETCH - Flexion, Single Knee to Chest   Lie on a firm bed or floor with both legs extended in front of you.  Keeping one leg in contact with the floor, bring your opposite knee to your chest. Hold your leg in place by either grabbing behind your thigh or at your knee.  Pull until you feel a gentle stretch in your lower back. Hold __________ seconds.  Slowly release your grasp and repeat the exercise with the opposite side. Repeat __________ times. Complete this exercise __________ times per day.  STRETCH - Flexion, Double Knee to Chest   Lie on a firm bed or floor with both legs extended in front of you.  Keeping one leg in contact with the floor, bring your opposite knee to your chest.  Tense your stomach muscles to support your back and then lift your other knee to your chest. Hold your legs in place by either grabbing behind your thighs or at your knees.  Pull both knees toward your chest until you feel a gentle stretch in your lower back. Hold __________ seconds.  Tense your stomach muscles and slowly return one leg at a time to the floor. Repeat __________ times. Complete this exercise __________ times per day.  STRETCH -  Low Trunk Rotation  Lie on a firm bed or floor. Keeping your legs in front of you, bend your knees so they are both pointed toward the ceiling and your feet are flat on the floor.  Extend your arms out to the side. This will stabilize your upper body by keeping your shoulders in contact with the floor.  Gently and slowly drop both knees together to one side until you feel a gentle stretch in your lower back. Hold for __________ seconds.  Tense your stomach muscles to support your lower back as you bring your knees back to the starting position. Repeat the exercise to the other side. Repeat __________ times. Complete this exercise __________ times per day  EXTENSION RANGE OF MOTION AND FLEXIBILITY EXERCISES: STRETCH - Extension, Prone on Elbows   Lie on your stomach on the floor, a bed will be too soft. Place your palms about shoulder width apart and at the height of your head.  Place your elbows under your shoulders. If this is too painful, stack pillows under your chest.  Allow your body to relax so that your hips drop lower and make contact more completely with the floor.  Hold this position for __________ seconds.  Slowly return to lying flat on the floor. Repeat __________ times. Complete this exercise __________ times per day.  RANGE OF MOTION - Extension, Prone Press Ups  Lie on your stomach on the floor, a bed will be too soft. Place your palms about shoulder width apart and at the height of your head.  Keeping your back as relaxed as possible, slowly straighten your elbows while keeping your hips on the floor. You may adjust the placement of your hands to maximize your comfort. As you gain motion, your hands will come more underneath your shoulders.  Hold this position __________ seconds.  Slowly return to lying flat on the floor. Repeat __________ times. Complete this exercise __________ times per day.  RANGE OF MOTION- Quadruped, Neutral Spine   Assume a hands and knees  position on a firm surface. Keep your hands under your shoulders and your knees under your hips. You may place padding under your knees for comfort.  Drop your head and point your tail bone toward the ground below you. This will round out your lower back like an angry cat. Hold this position for __________ seconds.  Slowly lift your head and release your tail bone so that your back sags into a large arch, like an old horse.  Hold this position for __________ seconds.  Repeat this until you feel limber in your lower back.  Now, find your "sweet spot." This will be the most comfortable position somewhere between the two previous positions. This is your neutral spine. Once you have found this position, tense your stomach muscles to support your lower back.  Hold this position for __________ seconds. Repeat __________ times. Complete this exercise __________ times per day.  STRENGTHENING EXERCISES - Low Back Strain These exercises may help you when beginning to rehabilitate your injury. These exercises should be done near your "sweet spot." This is the neutral, low-back arch, somewhere between fully rounded and fully arched, that is your least painful position. When performed in this safe range of motion, these exercises can be used for people who have either a flexion or extension based injury. These exercises may resolve your symptoms with or without further involvement from your physician, physical therapist or athletic trainer. While completing these exercises, remember:   Muscles can gain both the endurance and the strength needed for everyday activities through controlled exercises.  Complete these exercises as instructed by your physician, physical therapist or athletic trainer. Increase the resistance and repetitions only as guided.  You may experience muscle soreness or fatigue, but the pain or discomfort you are trying to eliminate should never worsen during these exercises. If this pain  does worsen, stop and make certain you are following the directions exactly. If the pain is still present after adjustments, discontinue the exercise until you can discuss the trouble with your caregiver. STRENGTHENING - Deep Abdominals, Pelvic Tilt  Lie on a firm bed or floor. Keeping your legs in front of you, bend your knees so they are both pointed toward the ceiling and your feet are flat on the floor.  Tense your lower abdominal muscles to press your lower back into the floor. This motion will rotate your pelvis so that your tail bone is scooping upwards rather than pointing at your feet or into the floor.  With a gentle tension and even breathing, hold this position for __________ seconds. Repeat __________ times. Complete this exercise __________ times per day.  STRENGTHENING - Abdominals, Crunches   Lie on a firm bed or floor. Keeping your legs in front of you, bend your knees so they are both pointed toward the ceiling and your feet are flat on the floor. Cross your arms over your chest.  Slightly tip your chin down without bending your neck.  Tense your abdominals and slowly lift your trunk high enough to just clear your shoulder blades. Lifting higher can put excessive stress on the lower back and does not further strengthen your abdominal muscles.  Control your return to the starting position. Repeat __________ times. Complete this exercise __________ times per day.  STRENGTHENING - Quadruped, Opposite UE/LE Lift   Assume a hands and knees position on a firm surface. Keep your hands under your shoulders and your knees under your hips. You may place padding under your knees for comfort.  Find your neutral spine and gently tense your abdominal muscles so that you can maintain this position. Your shoulders and hips should form a rectangle that is parallel with the floor and is not twisted.  Keeping your trunk steady, lift your right hand no higher than your shoulder and then your  left leg no higher than your hip. Make sure you are not holding your breath. Hold this position __________ seconds.  Continuing to keep your abdominal muscles tense and your back steady, slowly return to your starting position. Repeat with the opposite arm and leg. Repeat __________ times. Complete this exercise __________ times per day.  STRENGTHENING - Lower Abdominals, Double Knee Lift  Lie on a firm bed or floor. Keeping your legs in front of you, bend your knees so they are both pointed toward the ceiling and your feet are flat on the floor.  Tense your abdominal muscles  to brace your lower back and slowly lift both of your knees until they come over your hips. Be certain not to hold your breath.  Hold __________ seconds. Using your abdominal muscles, return to the starting position in a slow and controlled manner. Repeat __________ times. Complete this exercise __________ times per day.  POSTURE AND BODY MECHANICS CONSIDERATIONS - Low Back Strain Keeping correct posture when sitting, standing or completing your activities will reduce the stress put on different body tissues, allowing injured tissues a chance to heal and limiting painful experiences. The following are general guidelines for improved posture. Your physician or physical therapist will provide you with any instructions specific to your needs. While reading these guidelines, remember:  The exercises prescribed by your provider will help you have the flexibility and strength to maintain correct postures.  The correct posture provides the best environment for your joints to work. All of your joints have less wear and tear when properly supported by a spine with good posture. This means you will experience a healthier, less painful body.  Correct posture must be practiced with all of your activities, especially prolonged sitting and standing. Correct posture is as important when doing repetitive low-stress activities (typing) as it  is when doing a single heavy-load activity (lifting). RESTING POSITIONS Consider which positions are most painful for you when choosing a resting position. If you have pain with flexion-based activities (sitting, bending, stooping, squatting), choose a position that allows you to rest in a less flexed posture. You would want to avoid curling into a fetal position on your side. If your pain worsens with extension-based activities (prolonged standing, working overhead), avoid resting in an extended position such as sleeping on your stomach. Most people will find more comfort when they rest with their spine in a more neutral position, neither too rounded nor too arched. Lying on a non-sagging bed on your side with a pillow between your knees, or on your back with a pillow under your knees will often provide some relief. Keep in mind, being in any one position for a prolonged period of time, no matter how correct your posture, can still lead to stiffness. PROPER SITTING POSTURE In order to minimize stress and discomfort on your spine, you must sit with correct posture. Sitting with good posture should be effortless for a healthy body. Returning to good posture is a gradual process. Many people can work toward this most comfortably by using various supports until they have the flexibility and strength to maintain this posture on their own. When sitting with proper posture, your ears will fall over your shoulders and your shoulders will fall over your hips. You should use the back of the chair to support your upper back. Your lower back will be in a neutral position, just slightly arched. You may place a small pillow or folded towel at the base of your lower back for support.  When working at a desk, create an environment that supports good, upright posture. Without extra support, muscles tire, which leads to excessive strain on joints and other tissues. Keep these recommendations in mind: CHAIR:  A chair should  be able to slide under your desk when your back makes contact with the back of the chair. This allows you to work closely.  The chair's height should allow your eyes to be level with the upper part of your monitor and your hands to be slightly lower than your elbows. BODY POSITION  Your feet should make contact with  the floor. If this is not possible, use a foot rest.  Keep your ears over your shoulders. This will reduce stress on your neck and lower back. INCORRECT SITTING POSTURES  If you are feeling tired and unable to assume a healthy sitting posture, do not slouch or slump. This puts excessive strain on your back tissues, causing more damage and pain. Healthier options include:  Using more support, like a lumbar pillow.  Switching tasks to something that requires you to be upright or walking.  Talking a brief walk.  Lying down to rest in a neutral-spine position. PROLONGED STANDING WHILE SLIGHTLY LEANING FORWARD  When completing a task that requires you to lean forward while standing in one place for a long time, place either foot up on a stationary 2-4 inch high object to help maintain the best posture. When both feet are on the ground, the lower back tends to lose its slight inward curve. If this curve flattens (or becomes too large), then the back and your other joints will experience too much stress, tire more quickly, and can cause pain. CORRECT STANDING POSTURES Proper standing posture should be assumed with all daily activities, even if they only take a few moments, like when brushing your teeth. As in sitting, your ears should fall over your shoulders and your shoulders should fall over your hips. You should keep a slight tension in your abdominal muscles to brace your spine. Your tailbone should point down to the ground, not behind your body, resulting in an over-extended swayback posture.  INCORRECT STANDING POSTURES  Common incorrect standing postures include a forward head,  locked knees and/or an excessive swayback. WALKING Walk with an upright posture. Your ears, shoulders and hips should all line-up. PROLONGED ACTIVITY IN A FLEXED POSITION When completing a task that requires you to bend forward at your waist or lean over a low surface, try to find a way to stabilize 3 out of 4 of your limbs. You can place a hand or elbow on your thigh or rest a knee on the surface you are reaching across. This will provide you more stability so that your muscles do not fatigue as quickly. By keeping your knees relaxed, or slightly bent, you will also reduce stress across your lower back. CORRECT LIFTING TECHNIQUES DO :   Assume a wide stance. This will provide you more stability and the opportunity to get as close as possible to the object which you are lifting.  Tense your abdominals to brace your spine. Bend at the knees and hips. Keeping your back locked in a neutral-spine position, lift using your leg muscles. Lift with your legs, keeping your back straight.  Test the weight of unknown objects before attempting to lift them.  Try to keep your elbows locked down at your sides in order get the best strength from your shoulders when carrying an object.  Always ask for help when lifting heavy or awkward objects. INCORRECT LIFTING TECHNIQUES DO NOT:   Lock your knees when lifting, even if it is a small object.  Bend and twist. Pivot at your feet or move your feet when needing to change directions.  Assume that you can safely pick up even a paper clip without proper posture. Document Released: 01/22/2005 Document Revised: 04/16/2011 Document Reviewed: 05/06/2008 [facility] Patient Information 2015 [place], [place]. This information is not intended to replace advice given to you by your health care provider. Make sure you discuss any questions you have with your health care provider.

## 2014-04-13 DIAGNOSIS — M5442 Lumbago with sciatica, left side: Secondary | ICD-10-CM | POA: Insufficient documentation

## 2014-04-13 DIAGNOSIS — M25511 Pain in right shoulder: Secondary | ICD-10-CM | POA: Insufficient documentation

## 2014-04-21 ENCOUNTER — Telehealth: Payer: Self-pay | Admitting: *Deleted

## 2014-04-21 ENCOUNTER — Encounter: Payer: Self-pay | Admitting: *Deleted

## 2014-04-21 NOTE — Telephone Encounter (Signed)
Pt left vm stating that he was unable to work the week of 3/5-3/11 and wanted to know if you would write a letter for him to take to his job.
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
## 2014-04-22 NOTE — Telephone Encounter (Signed)
Letter printed & placed up front for pick up.  Pt notified.

## 2015-02-03 ENCOUNTER — Encounter: Payer: Self-pay | Admitting: Family Medicine

## 2015-02-03 ENCOUNTER — Ambulatory Visit (INDEPENDENT_AMBULATORY_CARE_PROVIDER_SITE_OTHER): Payer: BLUE CROSS/BLUE SHIELD | Admitting: Family Medicine

## 2015-02-03 VITALS — BP 132/84 | HR 78 | Ht 69.0 in | Wt 205.0 lb

## 2015-02-03 DIAGNOSIS — Z23 Encounter for immunization: Secondary | ICD-10-CM | POA: Diagnosis not present

## 2015-02-03 DIAGNOSIS — M5442 Lumbago with sciatica, left side: Secondary | ICD-10-CM

## 2015-02-03 MED ORDER — MELOXICAM 15 MG PO TABS: 15.0000 mg | ORAL_TABLET | Freq: Every day | ORAL | Status: DC | PRN

## 2015-02-03 MED ORDER — PREDNISONE 20 MG PO TABS: 40.0000 mg | ORAL_TABLET | Freq: Every day | ORAL | Status: DC

## 2015-02-03 MED ORDER — KETOROLAC TROMETHAMINE 60 MG/2ML IM SOLN
60.0000 mg | Freq: Once | INTRAMUSCULAR | Status: AC
  Administered 2015-02-03: 60 mg via INTRAMUSCULAR

## 2015-02-03 MED ORDER — HYDROCODONE-ACETAMINOPHEN 5-325 MG PO TABS: 1.0000 | ORAL_TABLET | Freq: Three times a day (TID) | ORAL | Status: DC | PRN

## 2015-02-03 MED ORDER — CYCLOBENZAPRINE HCL 10 MG PO TABS: 10.0000 mg | ORAL_TABLET | Freq: Two times a day (BID) | ORAL | Status: DC | PRN
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
## 2015-02-04 DIAGNOSIS — Z23 Encounter for immunization: Secondary | ICD-10-CM

## 2015-02-28 ENCOUNTER — Ambulatory Visit (INDEPENDENT_AMBULATORY_CARE_PROVIDER_SITE_OTHER): Payer: BLUE CROSS/BLUE SHIELD | Admitting: Physician Assistant

## 2015-02-28 ENCOUNTER — Encounter: Payer: Self-pay | Admitting: Physician Assistant

## 2015-02-28 VITALS — BP 133/83 | HR 69 | Wt 202.0 lb

## 2015-02-28 DIAGNOSIS — M545 Low back pain, unspecified: Secondary | ICD-10-CM

## 2015-02-28 MED ORDER — HYDROCODONE-ACETAMINOPHEN 5-325 MG PO TABS: 1.0000 | ORAL_TABLET | Freq: Three times a day (TID) | ORAL | Status: DC | PRN

## 2015-02-28 MED ORDER — KETOROLAC TROMETHAMINE 60 MG/2ML IM SOLN
60.0000 mg | Freq: Once | INTRAMUSCULAR | Status: AC
  Administered 2015-02-28: 60 mg via INTRAMUSCULAR

## 2015-02-28 MED ORDER — PREDNISONE 20 MG PO TABS: 40.0000 mg | ORAL_TABLET | Freq: Every day | ORAL | Status: DC
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
## 2015-03-02 ENCOUNTER — Other Ambulatory Visit: Payer: Self-pay | Admitting: Family Medicine

## 2015-03-02 ENCOUNTER — Encounter: Payer: Self-pay | Admitting: Rehabilitative and Restorative Service Providers"

## 2015-03-02 ENCOUNTER — Ambulatory Visit (INDEPENDENT_AMBULATORY_CARE_PROVIDER_SITE_OTHER): Payer: BLUE CROSS/BLUE SHIELD | Admitting: Rehabilitative and Restorative Service Providers"

## 2015-03-02 DIAGNOSIS — Z7409 Other reduced mobility: Secondary | ICD-10-CM | POA: Diagnosis not present

## 2015-03-02 DIAGNOSIS — M5442 Lumbago with sciatica, left side: Secondary | ICD-10-CM | POA: Diagnosis not present

## 2015-03-02 DIAGNOSIS — R293 Abnormal posture: Secondary | ICD-10-CM | POA: Diagnosis not present

## 2015-03-02 DIAGNOSIS — M623 Immobility syndrome (paraplegic): Secondary | ICD-10-CM

## 2015-03-02 DIAGNOSIS — M2560 Stiffness of unspecified joint, not elsewhere classified: Secondary | ICD-10-CM

## 2015-03-02 NOTE — Patient Instructions (Signed)
Avoid prolonged sitting- stand every 30-45 min and arch back   Trunk Extension    Standing, place back of open hands on low back. Straighten spine then arch the back and move shoulders back. Repeat __3-4__ times per session. Do _several___ sessions per day  Add lumbar support to sitting positions   Abdominal Bracing With Pelvic Floor (Hook-Lying)    With neutral spine, tighten pelvic floor and abdominals, sucking belly button to back bone; tighten muscles in back at waist.  Hold 10 sec Repeat _10__ times. Do _several__ times a day. Progress to do this in sitting; standing; with functional activities   HIP: Hamstrings - Supine    Place strap around foot. Raise leg up, keep knee straight. Hold _30_ seconds. _3__ reps per set _2-3__ days per week   Outer Hip Stretch: Reclined IT Band Stretch (Strap)    Strap around opposite foot, pull across only as far as possible with shoulders on mat. Hold for _30 sec. Repeat __3__ times each leg. 2-3 times/day   Trunk: Prone Extension (Press-Ups)    Lie on stomach on firm, flat surface. Relax bottom and legs. Raise chest in air with elbows straight. Keep hips flat on surface, sag stomach. Hold __2-3__ seconds. Repeat __10__ times. Do _3-4___ sessions per day. CAUTION: Movement should be gentle and slow.     Quads / HF, Prone    Lie face down, knees together. Grasp one ankle with same-side hand. Use towel if needed to reach. Gently pull foot toward buttock. Hold 30___ seconds. Repeat _3__ times per session. Do __2-3_ sessions per day.    TENS UNIT: This is helpful for muscle pain and spasm.   Search and Purchase a TENS 7000 2nd edition at www.tenspros.com. It should be less than $30.     TENS unit instructions: Do not shower or bathe with the unit on Turn the unit off before removing electrodes or batteries If the electrodes lose stickiness add a drop of water to the electrodes after they are disconnected from the unit  and place on plastic sheet. If you continued to have difficulty, call the TENS unit company to purchase more electrodes. Do not apply lotion on the skin area prior to use. Make sure the skin is clean and dry as this will help prolong the life of the electrodes. After use, always check skin for unusual red areas, rash or other skin difficulties. If there are any skin problems, does not apply electrodes to the same area. Never remove the electrodes from the unit by pulling the wires. Do not use the TENS unit or electrodes other than as directed. Do not change electrode placement without consultating your therapist or physician. Keep 2 fingers with between each electrode.
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
## 2015-03-07 ENCOUNTER — Encounter: Payer: BLUE CROSS/BLUE SHIELD | Admitting: Rehabilitative and Restorative Service Providers"

## 2015-03-09 ENCOUNTER — Encounter: Payer: BLUE CROSS/BLUE SHIELD | Admitting: Physical Therapy

## 2015-03-14 ENCOUNTER — Encounter: Payer: BLUE CROSS/BLUE SHIELD | Admitting: Physical Therapy

## 2015-03-16 ENCOUNTER — Encounter: Payer: BLUE CROSS/BLUE SHIELD | Admitting: Physical Therapy

## 2015-03-21 ENCOUNTER — Encounter: Payer: BLUE CROSS/BLUE SHIELD | Admitting: Rehabilitative and Restorative Service Providers"

## 2015-03-23 ENCOUNTER — Encounter: Payer: BLUE CROSS/BLUE SHIELD | Admitting: Physical Therapy

## 2015-05-02 ENCOUNTER — Encounter: Payer: Self-pay | Admitting: *Deleted

## 2015-05-05 ENCOUNTER — Ambulatory Visit: Payer: BLUE CROSS/BLUE SHIELD | Admitting: Family Medicine

## 2015-10-24 ENCOUNTER — Ambulatory Visit (INDEPENDENT_AMBULATORY_CARE_PROVIDER_SITE_OTHER): Payer: BLUE CROSS/BLUE SHIELD | Admitting: Physician Assistant

## 2015-10-24 ENCOUNTER — Encounter: Payer: Self-pay | Admitting: Physician Assistant

## 2015-10-24 VITALS — BP 109/71 | HR 71 | Ht 69.0 in | Wt 208.0 lb

## 2015-10-24 DIAGNOSIS — M5442 Lumbago with sciatica, left side: Secondary | ICD-10-CM

## 2015-10-24 MED ORDER — KETOROLAC TROMETHAMINE 60 MG/2ML IM SOLN
60.0000 mg | Freq: Once | INTRAMUSCULAR | Status: AC
  Administered 2015-10-24: 60 mg via INTRAMUSCULAR

## 2015-10-24 MED ORDER — PREDNISONE 20 MG PO TABS: 40.0000 mg | ORAL_TABLET | Freq: Every day | ORAL | 0 refills | Status: DC

## 2015-10-24 MED ORDER — HYDROCODONE-ACETAMINOPHEN 5-325 MG PO TABS: 1.0000 | ORAL_TABLET | Freq: Three times a day (TID) | ORAL | 0 refills | Status: DC | PRN

## 2015-10-24 MED ORDER — CYCLOBENZAPRINE HCL 10 MG PO TABS: 10.0000 mg | ORAL_TABLET | Freq: Two times a day (BID) | ORAL | 0 refills | Status: DC | PRN

## 2015-10-25 ENCOUNTER — Encounter: Payer: Self-pay | Admitting: Physician Assistant

## 2015-10-25 NOTE — Progress Notes (Signed)
   Subjective:    Patient ID: Bradley Bernard, male    DOB: [birth year], 45 y.o.   MRN: [id number]  HPI Pt is a 45 yo male who presents to the clinic with bilateral low back pain with left scatica exacerbation. His last exacerbation was 02/2015. His pain usually resolves with prednisone, flexeril, mobic and toradol shot. He denies any injury. He was not able to go to work today. All movement at waist causes pain and discomfort. Denies any bowel or bladder dysfunction or saddle anesthesia.    Review of Systems  All other systems reviewed and are negative.      Objective:   Physical Exam  Constitutional: He is oriented to person, place, and time. He appears well-developed and well-nourished.  HENT:  Head: Normocephalic and atraumatic.  Cardiovascular: Normal rate, regular rhythm and normal heart sounds.   Musculoskeletal:  Lumbar spine paraspinous tightness and tenderness.  Negative left straight leg test.   Neurological: He is alert and oriented to person, place, and time.  Psychiatric: He has a normal mood and affect. His behavior is normal.          Assessment & Plan:  Bilateral low back pain with left sided sciatica- treated with prednisone, muscle relaxer, norco #20. Toradol 60mg  given in clinic today. Discussed heat and low back stretches and exercises.

## 2016-03-02 ENCOUNTER — Ambulatory Visit (INDEPENDENT_AMBULATORY_CARE_PROVIDER_SITE_OTHER): Payer: BLUE CROSS/BLUE SHIELD | Admitting: Family Medicine

## 2016-03-02 ENCOUNTER — Encounter: Payer: Self-pay | Admitting: Family Medicine

## 2016-03-02 VITALS — BP 146/87 | HR 77 | Temp 97.8°F | Wt 207.0 lb

## 2016-03-02 DIAGNOSIS — R69 Illness, unspecified: Secondary | ICD-10-CM | POA: Diagnosis not present

## 2016-03-02 DIAGNOSIS — J111 Influenza due to unidentified influenza virus with other respiratory manifestations: Secondary | ICD-10-CM

## 2016-03-02 MED ORDER — OSELTAMIVIR PHOSPHATE 75 MG PO CAPS: 75.0000 mg | ORAL_CAPSULE | Freq: Two times a day (BID) | ORAL | 0 refills | Status: DC

## 2016-03-02 NOTE — Progress Notes (Signed)
   Subjective:    Patient ID: Trixie Rude, male    DOB: 24-May-1970, 46 y.o.   MRN: [id number]  HPI Patient complains of 2 days of flulike symptoms including body aches, sore throat, sweats, fatigue, dry cough and some nasal congestion. Some diarrhea. Report fever but hasn't measured. Using IBU, tylenol for fever and bodyaches.    Review of Systems     Objective:   Physical Exam  Constitutional: He is oriented to person, place, and time. He appears well-developed and well-nourished.  HENT:  Head: Normocephalic and atraumatic.  Right Ear: External ear normal.  Left Ear: External ear normal.  Nose: Nose normal.  Mouth/Throat: Oropharynx is clear and moist.  TMs and canals are clear.   Eyes: Conjunctivae and EOM are normal. Pupils are equal, round, and reactive to light.  Neck: Neck supple. No thyromegaly present.  Cardiovascular: Normal rate and normal heart sounds.   Pulmonary/Chest: Effort normal and breath sounds normal.  Lymphadenopathy:    He has no cervical adenopathy.  Neurological: He is alert and oriented to person, place, and time.  Skin: Skin is warm and dry.  Psychiatric: He has a normal mood and affect.          Flu like illness - tx with tamiflu since within 48 hour window.  F/U if not beter in one week. Stay hydrated.

## 2016-03-02 NOTE — Patient Instructions (Addendum)

## 2016-05-28 ENCOUNTER — Ambulatory Visit: Payer: BLUE CROSS/BLUE SHIELD | Admitting: Family Medicine

## 2016-05-28 DIAGNOSIS — N509 Disorder of male genital organs, unspecified: Secondary | ICD-10-CM | POA: Diagnosis not present

## 2016-07-20 ENCOUNTER — Encounter: Payer: BLUE CROSS/BLUE SHIELD | Admitting: Family Medicine

## 2016-07-31 ENCOUNTER — Telehealth: Payer: Self-pay | Admitting: *Deleted

## 2016-07-31 DIAGNOSIS — N390 Urinary tract infection, site not specified: Secondary | ICD-10-CM

## 2016-07-31 DIAGNOSIS — E785 Hyperlipidemia, unspecified: Secondary | ICD-10-CM

## 2016-07-31 DIAGNOSIS — Z1159 Encounter for screening for other viral diseases: Secondary | ICD-10-CM

## 2016-07-31 DIAGNOSIS — Z131 Encounter for screening for diabetes mellitus: Secondary | ICD-10-CM

## 2016-07-31 DIAGNOSIS — A499 Bacterial infection, unspecified: Secondary | ICD-10-CM

## 2016-07-31 NOTE — Telephone Encounter (Signed)
Labs ordered for upcoming CPE. 

## 2016-08-03 ENCOUNTER — Ambulatory Visit (INDEPENDENT_AMBULATORY_CARE_PROVIDER_SITE_OTHER): Payer: BLUE CROSS/BLUE SHIELD

## 2016-08-03 ENCOUNTER — Encounter: Payer: Self-pay | Admitting: Family Medicine

## 2016-08-03 ENCOUNTER — Ambulatory Visit (INDEPENDENT_AMBULATORY_CARE_PROVIDER_SITE_OTHER): Payer: BLUE CROSS/BLUE SHIELD | Admitting: Family Medicine

## 2016-08-03 VITALS — BP 126/78 | HR 72 | Ht 69.0 in | Wt 204.0 lb

## 2016-08-03 DIAGNOSIS — E785 Hyperlipidemia, unspecified: Secondary | ICD-10-CM | POA: Diagnosis not present

## 2016-08-03 DIAGNOSIS — M7989 Other specified soft tissue disorders: Secondary | ICD-10-CM | POA: Diagnosis not present

## 2016-08-03 DIAGNOSIS — N390 Urinary tract infection, site not specified: Secondary | ICD-10-CM | POA: Diagnosis not present

## 2016-08-03 DIAGNOSIS — M25572 Pain in left ankle and joints of left foot: Secondary | ICD-10-CM

## 2016-08-03 DIAGNOSIS — G8929 Other chronic pain: Secondary | ICD-10-CM | POA: Diagnosis not present

## 2016-08-03 DIAGNOSIS — A499 Bacterial infection, unspecified: Secondary | ICD-10-CM | POA: Diagnosis not present

## 2016-08-03 DIAGNOSIS — Z0000 Encounter for general adult medical examination without abnormal findings: Secondary | ICD-10-CM | POA: Diagnosis not present

## 2016-08-03 DIAGNOSIS — Z23 Encounter for immunization: Secondary | ICD-10-CM | POA: Diagnosis not present

## 2016-08-03 DIAGNOSIS — Z1159 Encounter for screening for other viral diseases: Secondary | ICD-10-CM | POA: Diagnosis not present

## 2016-08-03 LAB — COMPLETE METABOLIC PANEL WITH GFR
ALT: 36 U/L (ref 9–46)
AST: 23 U/L (ref 10–40)
Albumin: 4.2 g/dL (ref 3.6–5.1)
Alkaline Phosphatase: 59 U/L (ref 40–115)
BILIRUBIN TOTAL: 0.7 mg/dL (ref 0.2–1.2)
BUN: 13 mg/dL (ref 7–25)
CALCIUM: 9.4 mg/dL (ref 8.6–10.3)
CHLORIDE: 103 mmol/L (ref 98–110)
CO2: 27 mmol/L (ref 20–31)
CREATININE: 1.2 mg/dL (ref 0.60–1.35)
GFR, EST AFRICAN AMERICAN: 83 mL/min (ref >=60)
GFR, Est Non African American: 72 mL/min (ref >=60)
Glucose, Bld: 93 mg/dL (ref 65–99)
Potassium: 3.9 mmol/L (ref 3.5–5.3)
Sodium: 139 mmol/L (ref 135–146)
Total Protein: 7.3 g/dL (ref 6.1–8.1)

## 2016-08-03 LAB — LIPID PANEL W/REFLEX DIRECT LDL
CHOLESTEROL: 182 mg/dL (ref <200)
HDL: 32 mg/dL — ABNORMAL LOW (ref >40)
LDL-Cholesterol: 125 mg/dL — ABNORMAL HIGH
NON-HDL CHOLESTEROL (CALC): 150 mg/dL — AB (ref <130)
TRIGLYCERIDES: 141 mg/dL (ref <150)
Total CHOL/HDL Ratio: 5.7 Ratio — ABNORMAL HIGH (ref <5.0)

## 2016-08-03 NOTE — Patient Instructions (Signed)
Keep up a regular exercise program and make sure you are eating a healthy diet Try to eat 4 servings of dairy a day, or if you are lactose intolerant take a calcium with vitamin D daily.  Your vaccines are up to date.   

## 2016-08-03 NOTE — Progress Notes (Signed)
Subjective:    Patient ID: Bradley Bernard, male    DOB: 07-08-1970, 46 y.o.   MRN: [id number]  HPI 46 year male comes in today for complete physical exam. He does have a couple concerns that he would like to discuss. Overall he is feeling healthy and doing well. He is not getting any regular exercise currently. He is due for his teeth And is willing to get that today. He actually to go for his blood draw this morning.   Left ankle pain and swelling x 6 mo.  He rolled his ankle in the yard. He's been able to walk on it ever since then it's been intermittently swelling and is painful. He can roll the ankle in all directions but sometimes it will pop. He has not been taking any medication or icing it or otherwise treating it. He says it bothers him more when he's been standing on his foot for long periods of time.  Review of Systems  BP 126/78   Pulse 72   Ht 5\' 9"  (1.753 m)   Wt 204 lb (92.5 kg)   BMI 30.13 kg/m     No Known Allergies  Past Medical History:  Diagnosis Date  . Asthma   . Seasonal allergies     Past Surgical History:  Procedure Laterality Date  . KNEE SURGERY     right knee meniscu tear    Social History   Social History  . Marital status: Married    Spouse name: Brayton Layman  . Number of children: N/A  . Years of education: N/A   Occupational History  . [employer]    Social History Main Topics  . Smoking status: Former Smoker    Packs/day: 1.00    Years: 13.00  . Smokeless tobacco: Never Used  . Alcohol use No  . Drug use: No  . Sexual activity: Yes    Partners: Female   Other Topics Concern  . Not on file   Social History Narrative  . No narrative on file    Family History  Problem Relation Age of Onset  . Hypertension Father   . Breast cancer Mother     Outpatient Encounter Prescriptions as of 08/03/2016  Medication Sig  . [DISCONTINUED] oseltamivir (TAMIFLU) 75 MG capsule Take 1 capsule (75 mg total) by mouth 2 (two)  times daily.   No facility-administered encounter medications on file as of 08/03/2016.           Objective:   Physical Exam  Constitutional: He is oriented to person, place, and time. He appears well-developed and well-nourished.  HENT:  Head: Normocephalic and atraumatic.  Right Ear: External ear normal.  Left Ear: External ear normal.  Nose: Nose normal.  Mouth/Throat: Oropharynx is clear and moist.  Eyes: Conjunctivae and EOM are normal. Pupils are equal, round, and reactive to light.  Neck: Normal range of motion. Neck supple. No thyromegaly present.  Cardiovascular: Normal rate, regular rhythm, normal heart sounds and intact distal pulses.   Pulmonary/Chest: Effort normal and breath sounds normal.  Abdominal: Soft. Bowel sounds are normal. He exhibits no distension and no mass. There is no tenderness. There is no rebound and no guarding.  Musculoskeletal: Normal range of motion.  Left ankle with normal range of motion and strength in all directions. He does have some trace swelling over the lateral malleolus. With anterior drawer he has just a little increased laxity compared to his left foot with a little bit of  popping. No it's not painful.  Lymphadenopathy:    He has no cervical adenopathy.  Neurological: He is alert and oriented to person, place, and time. He has normal reflexes.  Skin: Skin is warm and dry.  Solar lentigo is on forearms and face.  Psychiatric: He has a normal mood and affect. His behavior is normal. Judgment and thought content normal.       Assessment & Plan:  CPE Keep up a regular exercise program and make sure you are eating a healthy diet Try to eat 4 servings of dairy a day, or if you are lactose intolerant take a calcium with vitamin D daily.  Your vaccines are up to date.  Left ankle pain/swelling-most likely residual from sprain. He does have just a little increased laxity with some popping on anterior toward test on that ankle. Will get  x-ray today and refer him to sports medicine for further evaluation since the injury has been persisting for 6 months.   Solar lentigo-gave reassurance. No sign of skin cancer.

## 2016-08-04 LAB — PSA: PSA: 0.7 ng/mL (ref <=4.0)

## 2016-08-04 LAB — HEPATITIS C ANTIBODY: HCV AB: NEGATIVE

## 2016-08-10 ENCOUNTER — Ambulatory Visit: Payer: BLUE CROSS/BLUE SHIELD | Admitting: Family Medicine

## 2017-01-02 ENCOUNTER — Emergency Department
Admission: EM | Admit: 2017-01-02 | Discharge: 2017-01-02 | Disposition: A | Discharge status: Home / Self Care | Payer: BLUE CROSS/BLUE SHIELD | Source: Home / Self Care | Attending: Family Medicine | Admitting: Family Medicine

## 2017-01-02 ENCOUNTER — Other Ambulatory Visit: Payer: Self-pay

## 2017-01-02 ENCOUNTER — Encounter: Payer: Self-pay | Admitting: *Deleted

## 2017-01-02 DIAGNOSIS — J069 Acute upper respiratory infection, unspecified: Secondary | ICD-10-CM | POA: Diagnosis not present

## 2017-01-02 DIAGNOSIS — B9789 Other viral agents as the cause of diseases classified elsewhere: Secondary | ICD-10-CM

## 2017-01-02 DIAGNOSIS — H6502 Acute serous otitis media, left ear: Secondary | ICD-10-CM

## 2017-01-02 DIAGNOSIS — J4521 Mild intermittent asthma with (acute) exacerbation: Secondary | ICD-10-CM

## 2017-01-02 MED ORDER — METHYLPREDNISOLONE SODIUM SUCC 125 MG IJ SOLR
80.0000 mg | Freq: Once | INTRAMUSCULAR | Status: AC
  Administered 2017-01-02: 80 mg via INTRAMUSCULAR

## 2017-01-02 MED ORDER — BENZONATATE 200 MG PO CAPS: ORAL_CAPSULE | ORAL | 0 refills | Status: DC

## 2017-01-02 MED ORDER — PREDNISONE 20 MG PO TABS: ORAL_TABLET | ORAL | 0 refills | Status: DC

## 2017-01-02 MED ORDER — AMOXICILLIN 875 MG PO TABS: 875.0000 mg | ORAL_TABLET | Freq: Two times a day (BID) | ORAL | 0 refills | Status: DC

## 2017-01-02 NOTE — ED Triage Notes (Signed)
Pt c/o productive cough and nasal congestion x 3 days. Reports sweats and chills at night. He has taken Robt and Mucinex without relief.

## 2017-01-02 NOTE — Discharge Instructions (Signed)
Begin prednisone Thursday 01/03/17. Take plain guaifenesin (1200mg  extended release tabs such as Mucinex) twice daily, with plenty of water, for cough and congestion.  May add Pseudoephedrine (30mg , one or two every 4 to 6 hours) for sinus congestion.  Get adequate rest.   May use Afrin nasal spray (or generic oxymetazoline) each morning for about 5 days and then discontinue.  Also recommend using saline nasal spray several times daily and saline nasal irrigation (AYR is a common brand).  Use Flonase nasal spray each morning after using Afrin nasal spray and saline nasal irrigation. Try warm salt water gargles for sore throat.  Stop all antihistamines for now, and other non-prescription cough/cold preparations. Continue albuterol inhaler as needed. If symptoms become significantly worse during the night or over the weekend, proceed to the local emergency room.
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
## 2017-05-10 ENCOUNTER — Encounter: Payer: Self-pay | Admitting: Family Medicine

## 2017-05-10 ENCOUNTER — Ambulatory Visit: Payer: BLUE CROSS/BLUE SHIELD | Admitting: Family Medicine

## 2017-05-10 VITALS — BP 127/70 | HR 75 | Ht 69.0 in | Wt 203.0 lb

## 2017-05-10 DIAGNOSIS — J0100 Acute maxillary sinusitis, unspecified: Secondary | ICD-10-CM

## 2017-05-10 DIAGNOSIS — D170 Benign lipomatous neoplasm of skin and subcutaneous tissue of head, face and neck: Secondary | ICD-10-CM | POA: Diagnosis not present

## 2017-05-10 MED ORDER — AMOXICILLIN-POT CLAVULANATE 875-125 MG PO TABS: 1.0000 | ORAL_TABLET | Freq: Two times a day (BID) | ORAL | 0 refills | Status: DC

## 2017-05-10 NOTE — Progress Notes (Signed)
Subjective:    Patient ID: Bradley Bernard, male    DOB: Jun 15, 1970, 47 y.o.   MRN: [id number]  HPI 47 year old male comes in today concerned about a lesion on his forehead.  He wants to know what to do about it.  Not painful or sore.  Has been there for awhile.    She also c/o sinusitis with sinus congestion postnasal drip bilateral maxillary pressure for about 2 weeks.  She said he says the pressure was incredibly intense.  Now he is getting discolored discharge from the nose as well as some blood.  No fever sweats or chills.  He said that several other family members have been sick as well.   Review of Systems  BP 127/70   Pulse 75   Ht 5\' 9"  (1.753 m)   Wt 203 lb (92.1 kg)   SpO2 99%   BMI 29.98 kg/m     No Known Allergies  Past Medical History:  Diagnosis Date  . Asthma   . Seasonal allergies     Past Surgical History:  Procedure Laterality Date  . KNEE SURGERY     right knee meniscu tear    Social History   Socioeconomic History  . Marital status: Married    Spouse name: Brayton Layman  . Number of children: Not on file  . Years of education: Not on file  . Highest education level: Not on file  Occupational History  . Occupation: Corporate investment banker: [employer]  . Financial resource strain: Not on file  . Food insecurity:    Worry: Not on file    Inability: Not on file  . Transportation needs:    Medical: Not on file    Non-medical: Not on file  Tobacco Use  . Smoking status: Former Smoker    Packs/day: 1.00    Years: 13.00    Pack years: 13.00  . Smokeless tobacco: Never Used  Substance and Sexual Activity  . Alcohol use: No  . Drug use: No  . Sexual activity: Yes    Partners: Female  Lifestyle  . Physical activity:    Days per week: Not on file    Minutes per session: Not on file  . Stress: Not on file  Relationships  . Social connections:    Talks on phone: Not on file    Gets together: Not on file    Attends  religious service: Not on file    Active member of club or organization: Not on file    Attends meetings of clubs or organizations: Not on file    Relationship status: Not on file  . Intimate partner violence:    Fear of current or ex partner: Not on file    Emotionally abused: Not on file    Physically abused: Not on file    Forced sexual activity: Not on file  Other Topics Concern  . Not on file  Social History Narrative  . Not on file    Family History  Problem Relation Age of Onset  . Breast cancer Mother   . Hypertension Father     Outpatient Encounter Medications as of 05/10/2017  Medication Sig  . amoxicillin-clavulanate (AUGMENTIN) 875-125 MG tablet Take 1 tablet by mouth 2 (two) times daily.  . [DISCONTINUED] amoxicillin (AMOXIL) 875 MG tablet Take 1 tablet (875 mg total) by mouth 2 (two) times daily.  . [DISCONTINUED] benzonatate (TESSALON) 200 MG capsule Take one cap by mouth  at bedtime as needed for cough.  May repeat in 4 to 6 hours  . [DISCONTINUED] predniSONE (DELTASONE) 20 MG tablet Take one tab by mouth twice daily for 4 days, then one daily for 3 days. Take with food.   No facility-administered encounter medications on file as of 05/10/2017.           Objective:   Physical Exam  Constitutional: He is oriented to person, place, and time. He appears well-developed and well-nourished.  HENT:  Head: Normocephalic and atraumatic.  Right Ear: External ear normal.  Left Ear: External ear normal.  Nose: Nose normal.  Mouth/Throat: Oropharynx is clear and moist.  TMs and canals are clear.   Eyes: Pupils are equal, round, and reactive to light. Conjunctivae and EOM are normal.  Neck: Neck supple. No thyromegaly present.  Cardiovascular: Normal rate and normal heart sounds.  Pulmonary/Chest: Effort normal and breath sounds normal.  Lymphadenopathy:    He has no cervical adenopathy.  Neurological: He is alert and oriented to person, place, and time.  Skin: Skin  is warm and dry.  He does have an approximately 2 cm smooth shaped round lipoma on the right forehead near the hairline.  Above the temple.  Nontender.  No erythema no swelling.  Psychiatric: He has a normal mood and affect.        Assessment & Plan:  Lipoma -reassurance.  It could also be a sebaceous cyst but it feels more like a lipoma on exam.  Refer to Dermatology for formal excision.  Acute sinusitis - will tx with Augmentin.  Call if not better in one week.  Could continue symptomatic care.

## 2017-05-10 NOTE — Patient Instructions (Signed)

## 2017-08-07 DIAGNOSIS — D170 Benign lipomatous neoplasm of skin and subcutaneous tissue of head, face and neck: Secondary | ICD-10-CM | POA: Diagnosis not present

## 2017-09-06 DIAGNOSIS — R238 Other skin changes: Secondary | ICD-10-CM | POA: Diagnosis not present

## 2017-09-06 DIAGNOSIS — D1801 Hemangioma of skin and subcutaneous tissue: Secondary | ICD-10-CM | POA: Diagnosis not present

## 2017-09-27 ENCOUNTER — Emergency Department (INDEPENDENT_AMBULATORY_CARE_PROVIDER_SITE_OTHER): Payer: BLUE CROSS/BLUE SHIELD

## 2017-09-27 ENCOUNTER — Emergency Department
Admission: EM | Admit: 2017-09-27 | Discharge: 2017-09-27 | Disposition: A | Discharge status: Home / Self Care | Payer: BLUE CROSS/BLUE SHIELD | Source: Home / Self Care | Attending: Family Medicine | Admitting: Family Medicine

## 2017-09-27 ENCOUNTER — Other Ambulatory Visit: Payer: Self-pay

## 2017-09-27 DIAGNOSIS — M5442 Lumbago with sciatica, left side: Secondary | ICD-10-CM | POA: Diagnosis not present

## 2017-09-27 DIAGNOSIS — S59902A Unspecified injury of left elbow, initial encounter: Secondary | ICD-10-CM | POA: Diagnosis not present

## 2017-09-27 DIAGNOSIS — M25522 Pain in left elbow: Secondary | ICD-10-CM

## 2017-09-27 DIAGNOSIS — S39012A Strain of muscle, fascia and tendon of lower back, initial encounter: Secondary | ICD-10-CM

## 2017-09-27 DIAGNOSIS — W010XXA Fall on same level from slipping, tripping and stumbling without subsequent striking against object, initial encounter: Secondary | ICD-10-CM

## 2017-09-27 DIAGNOSIS — M5136 Other intervertebral disc degeneration, lumbar region: Secondary | ICD-10-CM

## 2017-09-27 DIAGNOSIS — M4807 Spinal stenosis, lumbosacral region: Secondary | ICD-10-CM | POA: Diagnosis not present

## 2017-09-27 MED ORDER — METHOCARBAMOL 500 MG PO TABS: 500.0000 mg | ORAL_TABLET | Freq: Two times a day (BID) | ORAL | 0 refills | Status: DC

## 2017-09-27 NOTE — Discharge Instructions (Signed)
°  Robaxin (methocarbamol) is a muscle relaxer and may cause drowsiness. Do not drink alcohol, drive, or operate heavy machinery while taking.  Please follow up with family medicine or sports medicine in 1-2 weeks if not improving.
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
## 2017-09-27 NOTE — ED Triage Notes (Signed)
Pt was in [facility] and took a fall at [employer]. C/o left elbow pain when he puts pressure on it. Also lower back pain that radiates with numbness down left leg. Has not taken any pain meds for pain. Did not report fall to [facility].

## 2018-01-20 ENCOUNTER — Ambulatory Visit: Payer: BLUE CROSS/BLUE SHIELD | Admitting: Family Medicine

## 2018-01-21 ENCOUNTER — Encounter: Payer: Self-pay | Admitting: Family Medicine

## 2018-01-21 ENCOUNTER — Ambulatory Visit (INDEPENDENT_AMBULATORY_CARE_PROVIDER_SITE_OTHER): Payer: BLUE CROSS/BLUE SHIELD | Admitting: Family Medicine

## 2018-01-21 VITALS — BP 130/65 | HR 79 | Ht 69.0 in | Wt 204.0 lb

## 2018-01-21 DIAGNOSIS — M545 Low back pain, unspecified: Secondary | ICD-10-CM

## 2018-01-21 MED ORDER — KETOROLAC TROMETHAMINE 60 MG/2ML IM SOLN
60.0000 mg | Freq: Once | INTRAMUSCULAR | Status: AC
  Administered 2018-01-21: 60 mg via INTRAMUSCULAR

## 2018-01-21 MED ORDER — CYCLOBENZAPRINE HCL 10 MG PO TABS: 5.0000 mg | ORAL_TABLET | Freq: Every evening | ORAL | 0 refills | Status: DC | PRN

## 2018-01-21 MED ORDER — PREDNISONE 20 MG PO TABS: 40.0000 mg | ORAL_TABLET | Freq: Every day | ORAL | 0 refills | Status: DC

## 2018-01-21 NOTE — Progress Notes (Signed)
Acute Office Visit  Subjective:    Patient ID: Bradley Bernard, male    DOB: [birth year], 47 y.o.   MRN: [id number]  Chief Complaint  Patient presents with  . Back Pain    low back 8/10 dull pain,  using ice, heat, tylenol, sitting makes pain worse    HPI Patient is in today for low back pain 8 days.  He was actually seen in our local urgent care about 4 months ago for low back pain.  Actually had an x-ray at that time which showed some mild degenerative disc disease at L5-S1 as well as some to space narrowing and spurring.  And some facet disease at L4-5 and L5-S1. Reports pain is 8/10.  Started last Tuesday.  Only been using ice, heat, Tylenol.  Sitting makes it worse.  Denies any radicular symptoms.  Pain is mostly midline.  Doing a lot of heavy lifting around work as they have been doing a home remodel.  Study Result   CLINICAL DATA:  Left lower back pain  EXAM: LUMBAR SPINE - COMPLETE 4+ VIEW  COMPARISON:  04/12/2014  FINDINGS: Mild degenerative disc disease changes at L5-S1 with disc space narrowing and spurring. Early degenerative facet disease at L4-5 and L5-S1. Normal alignment. No fracture. SI joints are symmetric and unremarkable.  IMPRESSION: Early degenerative disc and facet disease in the lower lumbar spine. No acute bony abnormality.       Past Medical History:  Diagnosis Date  . Asthma   . Seasonal allergies     Past Surgical History:  Procedure Laterality Date  . KNEE SURGERY     right knee meniscu tear    Family History  Problem Relation Age of Onset  . Breast cancer Mother   . Hypertension Father     Social History   Socioeconomic History  . Marital status: Married    Spouse name: Brayton Layman  . Number of children: Not on file  . Years of education: Not on file  . Highest education level: Not on file  Occupational History  . Occupation: Corporate investment banker: [employer]  . Financial resource strain: Not  on file  . Food insecurity:    Worry: Not on file    Inability: Not on file  . Transportation needs:    Medical: Not on file    Non-medical: Not on file  Tobacco Use  . Smoking status: Former Smoker    Packs/day: 1.00    Years: 13.00    Pack years: 13.00  . Smokeless tobacco: Never Used  Substance and Sexual Activity  . Alcohol use: No  . Drug use: No  . Sexual activity: Yes    Partners: Female  Lifestyle  . Physical activity:    Days per week: Not on file    Minutes per session: Not on file  . Stress: Not on file  Relationships  . Social connections:    Talks on phone: Not on file    Gets together: Not on file    Attends religious service: Not on file    Active member of club or organization: Not on file    Attends meetings of clubs or organizations: Not on file    Relationship status: Not on file  . Intimate partner violence:    Fear of current or ex partner: Not on file    Emotionally abused: Not on file    Physically abused: Not on file  Forced sexual activity: Not on file  Other Topics Concern  . Not on file  Social History Narrative  . Not on file    Outpatient Medications Prior to Visit  Medication Sig Dispense Refill  . methocarbamol (ROBAXIN) 500 MG tablet Take 1 tablet (500 mg total) by mouth 2 (two) times daily. 20 tablet 0   No facility-administered medications prior to visit.     No Known Allergies  ROS     Objective:    Physical Exam  Constitutional: He is oriented to person, place, and time. He appears well-developed and well-nourished.  HENT:  Head: Normocephalic and atraumatic.  Eyes: Conjunctivae and EOM are normal.  Cardiovascular: Normal rate.  Pulmonary/Chest: Effort normal.  Musculoskeletal:     Comments: Normal lumbar flexion, slightly decreased extension with pain and discomfort.  Normal rotation right left.  Some tightness with side bending to the right.  But normal side bending to the right and left.  Negative straight leg  raise bilaterally.  Hip, knee, ankle strength is 5-5 bilaterally.  Very tender over both SI joints and over the lumbar spine itself.  Neurological: He is alert and oriented to person, place, and time.  Skin: Skin is dry. No pallor.  Psychiatric: He has a normal mood and affect. His behavior is normal.  Vitals reviewed.   BP 130/65   Pulse 79   Ht 5\' 9"  (1.753 m)   Wt 204 lb (92.5 kg)   SpO2 98%   BMI 30.13 kg/m  Wt Readings from Last 3 Encounters:  01/21/18 204 lb (92.5 kg)  09/27/17 203 lb (92.1 kg)  05/10/17 203 lb (92.1 kg)    Health Maintenance Due  Topic Date Due  . HIV Screening  04/02/1984  . INFLUENZA VACCINE  09/05/2017    There are no preventive care reminders to display for this patient.   Lab Results  Component Value Date   TSH 1.259 09/17/2007   Lab Results  Component Value Date   WBC 6.3 09/17/2007   HGB 15.4 09/17/2007   HCT 47.0 09/17/2007   MCV 85.5 09/17/2007   PLT 283 09/17/2007   Lab Results  Component Value Date   NA 139 08/03/2016   K 3.9 08/03/2016   CO2 27 08/03/2016   GLUCOSE 93 08/03/2016   BUN 13 08/03/2016   CREATININE 1.20 08/03/2016   BILITOT 0.7 08/03/2016   ALKPHOS 59 08/03/2016   AST 23 08/03/2016   ALT 36 08/03/2016   PROT 7.3 08/03/2016   ALBUMIN 4.2 08/03/2016   CALCIUM 9.4 08/03/2016   Lab Results  Component Value Date   CHOL 182 08/03/2016   Lab Results  Component Value Date   HDL 32 (L) 08/03/2016   Lab Results  Component Value Date   LDLCALC 129 (H) 04/15/2012   Lab Results  Component Value Date   TRIG 141 08/03/2016   Lab Results  Component Value Date   CHOLHDL 5.7 (H) 08/03/2016   No results found for: HGBA1C     Assessment & Plan:   Problem List Items Addressed This Visit    None    Visit Diagnoses    Acute midline low back pain without sciatica    -  Primary   Relevant Medications   predniSONE (DELTASONE) 20 MG tablet   cyclobenzaprine (FLEXERIL) 10 MG tablet   ketorolac (TORADOL)  injection 60 mg (Completed) (Start on 01/21/2018  4:15 PM)    Treatment options.  He is done well with Toradol injection and  oral prednisone in the past.  We will also send over muscle relaxer as needed use.  He is already taking some naproxen so continue with that for the next week.  He Artie has stretches at home so just encouraged and pull those out and start doing those again.  If he is not improving over the next 2 weeks and please let us know.   Meds ordered this encounter  Medications  . predniSONE (DELTASONE) 20 MG tablet    Sig: Take 2 tablets (40 mg total) by mouth daily with breakfast.    Dispense:  10 tablet    Refill:  0  . cyclobenzaprine (FLEXERIL) 10 MG tablet    Sig: Take 0.5-1 tablets (5-10 mg total) by mouth at bedtime as needed for muscle spasms.    Dispense:  30 tablet    Refill:  0  . ketorolac (TORADOL) injection 60 mg     Beatrice Lecher, MD

## 2018-04-11 ENCOUNTER — Telehealth: Payer: Self-pay | Admitting: Family Medicine

## 2018-04-11 ENCOUNTER — Ambulatory Visit (INDEPENDENT_AMBULATORY_CARE_PROVIDER_SITE_OTHER): Payer: BLUE CROSS/BLUE SHIELD

## 2018-04-11 ENCOUNTER — Encounter: Payer: Self-pay | Admitting: Family Medicine

## 2018-04-11 ENCOUNTER — Ambulatory Visit (INDEPENDENT_AMBULATORY_CARE_PROVIDER_SITE_OTHER): Payer: BLUE CROSS/BLUE SHIELD | Admitting: Family Medicine

## 2018-04-11 VITALS — BP 120/79 | HR 66 | Ht 67.0 in | Wt 204.0 lb

## 2018-04-11 DIAGNOSIS — M1711 Unilateral primary osteoarthritis, right knee: Secondary | ICD-10-CM

## 2018-04-11 DIAGNOSIS — M25561 Pain in right knee: Secondary | ICD-10-CM

## 2018-04-11 DIAGNOSIS — M25361 Other instability, right knee: Secondary | ICD-10-CM

## 2018-04-11 DIAGNOSIS — Z0000 Encounter for general adult medical examination without abnormal findings: Secondary | ICD-10-CM

## 2018-04-11 DIAGNOSIS — M25562 Pain in left knee: Secondary | ICD-10-CM | POA: Diagnosis not present

## 2018-04-11 DIAGNOSIS — M7989 Other specified soft tissue disorders: Secondary | ICD-10-CM | POA: Diagnosis not present

## 2018-04-11 DIAGNOSIS — E785 Hyperlipidemia, unspecified: Secondary | ICD-10-CM

## 2018-04-11 MED ORDER — DICLOFENAC SODIUM 1 % TD GEL: 4.0000 g | Freq: Four times a day (QID) | TRANSDERMAL | 11 refills | Status: DC

## 2018-04-11 NOTE — Telephone Encounter (Signed)
Pt would like labs done prior to appt with PCP. Labs pended for review. Pt requested to have testosterone and PSA done as well.

## 2018-04-11 NOTE — Progress Notes (Signed)
Bradley Bernard is a 48 y.o. male who presents to [facility] today for right knee pain and instability.  Bradley Bernard has a history of right knee injury in 2013 where he had a tibial spine avulsion fracture.  This was managed conservatively until he ultimately had a right knee ACL reconstruction May 2014 with Achilles allograft.  Additionally he had medial and lateral meniscectomy and chondroplasty during the procedure.  He notes his knee has never really been back to normal since the surgery.  He feels instability occasionally during activities and notes clicking and popping.  He denies locking or catching.  However most bothersome he notes his knee will occasionally just give way causing falls.  This is happened several times over the last year or so.  He was seen with orthopedic surgery several times after his surgery but never fully resolved.  He has a hinged knee brace which he finds to be not helpful.  He notes his knee is sometimes painful and sometimes swollen but his most dominant symptom is instability and giving way.   ROS as above:  Exam:  BP 120/79   Pulse 66   Ht 5\' 7"  (1.702 m)   Wt 204 lb (92.5 kg)   BMI 31.95 kg/m  Wt Readings from Last 5 Encounters:  04/11/18 204 lb (92.5 kg)  01/21/18 204 lb (92.5 kg)  09/27/17 203 lb (92.1 kg)  05/10/17 203 lb (92.1 kg)  01/02/17 210 lb (95.3 kg)    Gen: Well NAD HEENT: EOMI,  MMM Lungs: Normal work of breathing. CTABL Heart: RRR no MRG Abd: NABS, Soft. Nondistended, Nontender Exts: Brisk capillary refill, warm and well perfused.  Right knee: Well-appearing scars along medial knee and arthroscopic port scars. No significant effusion. No deformities no erythema.  Range of motion 0-120 degrees with mild crepitations.  Not particularly tender to palpation.  Laxity with anterior drawer testing.  No laxity with  posterior drawer testing. No laxity or pain with valgus varus stress testing. McMurray's test Intact flexion and extension strength.  Lab and Radiology Results X-ray images right knee personally independently reviewed Significant postsurgical degenerative changes right knee most dominantly at the lateral joint line.  No acute fractures.  DJD is moderate. Formal radiology review  Assessment and Plan: 49 y.o. male with right knee instability and giving way symptoms the last several years.  On physical exam patient does have laxity of the ACL.  X-ray does show postsurgical and posttraumatic degenerative changes.  We discussed options.  Neck step would be physical therapy and trial of diclofenac gel as well as compressive knee sleeve.  If not improving in 6 weeks would consider MRI for potential surgical planning.  PDMP not reviewed this encounter. Orders Placed This Encounter  Procedures  . DG Knee Complete 4 Views Right    Please include patellar sunrise, lateral, and weightbearing bilateral AP and bilateral rosenberg views    Standing Status:   Future    Number of Occurrences:   1    Standing Expiration Date:   06/11/2019    Order Specific Question:   Reason for exam:    Answer:   Please include patellar sunrise, lateral, and weightbearing bilateral AP and bilateral rosenberg views    Comments:   Please include patellar sunrise, lateral, and weightbearing bilateral AP and bilateral rosenberg views    Order Specific Question:   Preferred imaging location?    Answer:   [facility]  .  DG Knee 1-2 Views Left    Standing Status:   Future    Number of Occurrences:   1    Standing Expiration Date:   06/12/2019    Order Specific Question:   Reason for Exam (SYMPTOM  OR DIAGNOSIS REQUIRED)    Answer:   For use with right knee x-ray, bilateral AP and Rosenberg standing.    Order Specific Question:   Preferred imaging location?    Answer:   [facility]  . Ambulatory referral  to Physical Therapy    Referral Priority:   Routine    Referral Type:   Physical Medicine    Referral Reason:   Specialty Services Required    Requested Specialty:   Physical Therapy   Meds ordered this encounter  Medications  . diclofenac sodium (VOLTAREN) 1 % GEL    Sig: Apply 4 g topically 4 (four) times daily. To affected joint.    Dispense:  100 g    Refill:  11    Knee OA triad and failed ibuprofen and aleve     Historical information moved to improve visibility of documentation.  Past Medical History:  Diagnosis Date  . Asthma   . Seasonal allergies    Past Surgical History:  Procedure Laterality Date  . Right knee arthroscopic assisted anterior cruciate ligament tear with Achilles allograft. Right 06/20/2012   right knee meniscu tear  . Right knee medial and lateral meniscectomies Right 06/20/2012   Social History   Tobacco Use  . Smoking status: Former Smoker    Packs/day: 1.00    Years: 13.00    Pack years: 13.00  . Smokeless tobacco: Never Used  Substance Use Topics  . Alcohol use: No   family history includes Breast cancer in his mother; Hypertension in his father.  Medications: Current Outpatient Medications  Medication Sig Dispense Refill  . diclofenac sodium (VOLTAREN) 1 % GEL Apply 4 g topically 4 (four) times daily. To affected joint. 100 g 11   No current facility-administered medications for this visit.    No Known Allergies   Discussed warning signs or symptoms. Please see discharge instructions. Patient expresses understanding.

## 2018-04-11 NOTE — Patient Instructions (Signed)
Thank you for coming in today. Let do PT and home exercises.  Use diclofenac gel as needed.  Use compression sleeve during exercise and for 30 - 60 mins after exercise.  Let me know how you are doing after course of Pt.  If not well next step is MRI.

## 2018-04-14 NOTE — Telephone Encounter (Signed)
Labs signed

## 2018-04-14 NOTE — Telephone Encounter (Signed)
Pt left VM with status update

## 2018-04-16 DIAGNOSIS — Z0000 Encounter for general adult medical examination without abnormal findings: Secondary | ICD-10-CM | POA: Diagnosis not present

## 2018-04-16 DIAGNOSIS — E785 Hyperlipidemia, unspecified: Secondary | ICD-10-CM | POA: Diagnosis not present

## 2018-04-17 LAB — COMPLETE METABOLIC PANEL WITH GFR
AG Ratio: 1.5 (calc) (ref 1.0–2.5)
ALBUMIN MSPROF: 3.8 g/dL (ref 3.6–5.1)
ALT: 18 U/L (ref 9–46)
AST: 14 U/L (ref 10–40)
Alkaline phosphatase (APISO): 49 U/L (ref 36–130)
BILIRUBIN TOTAL: 0.5 mg/dL (ref 0.2–1.2)
BUN: 12 mg/dL (ref 7–25)
CHLORIDE: 106 mmol/L (ref 98–110)
CO2: 24 mmol/L (ref 20–32)
CREATININE: 1.05 mg/dL (ref 0.60–1.35)
Calcium: 9.2 mg/dL (ref 8.6–10.3)
GFR, EST AFRICAN AMERICAN: 96 mL/min/{1.73_m2} (ref >=60)
GFR, Est Non African American: 83 mL/min/{1.73_m2} (ref >=60)
GLUCOSE: 86 mg/dL (ref 65–99)
Globulin: 2.6 g/dL (calc) (ref 1.9–3.7)
Potassium: 4 mmol/L (ref 3.5–5.3)
Sodium: 139 mmol/L (ref 135–146)
TOTAL PROTEIN: 6.4 g/dL (ref 6.1–8.1)

## 2018-04-17 LAB — CBC
HEMATOCRIT: 46.7 % (ref 38.5–50.0)
HEMOGLOBIN: 15.8 g/dL (ref 13.2–17.1)
MCH: 29.4 pg (ref 27.0–33.0)
MCHC: 33.8 g/dL (ref 32.0–36.0)
MCV: 87 fL (ref 80.0–100.0)
MPV: 9.9 fL (ref 7.5–12.5)
Platelets: 252 10*3/uL (ref 140–400)
RBC: 5.37 10*6/uL (ref 4.20–5.80)
RDW: 12.3 % (ref 11.0–15.0)
WBC: 6.4 10*3/uL (ref 3.8–10.8)

## 2018-04-17 LAB — LIPID PANEL
CHOL/HDL RATIO: 5.3 (calc) — AB (ref <5.0)
CHOLESTEROL: 159 mg/dL (ref <200)
HDL: 30 mg/dL — AB (ref >=40)
LDL Cholesterol (Calc): 109 mg/dL (calc) — ABNORMAL HIGH
NON-HDL CHOLESTEROL (CALC): 129 mg/dL (ref <130)
Triglycerides: 104 mg/dL (ref <150)

## 2018-04-17 LAB — PSA: PSA: 0.7 ng/mL (ref <=4.0)

## 2018-04-17 LAB — TESTOSTERONE: Testosterone: 565 ng/dL (ref 250–827)

## 2018-04-21 ENCOUNTER — Encounter: Payer: Self-pay | Admitting: Family Medicine

## 2018-04-21 ENCOUNTER — Ambulatory Visit (INDEPENDENT_AMBULATORY_CARE_PROVIDER_SITE_OTHER): Payer: BLUE CROSS/BLUE SHIELD | Admitting: Family Medicine

## 2018-04-21 ENCOUNTER — Other Ambulatory Visit: Payer: Self-pay

## 2018-04-21 VITALS — BP 138/78 | HR 97 | Ht 67.0 in | Wt 202.0 lb

## 2018-04-21 DIAGNOSIS — N529 Male erectile dysfunction, unspecified: Secondary | ICD-10-CM

## 2018-04-21 DIAGNOSIS — Z0000 Encounter for general adult medical examination without abnormal findings: Secondary | ICD-10-CM

## 2018-04-21 NOTE — Progress Notes (Signed)
Pt rates his QOL as 1.Bradley Bernard, [name]

## 2018-04-21 NOTE — Patient Instructions (Signed)

## 2018-04-21 NOTE — Progress Notes (Addendum)
Established Patient Office Visit  Subjective:  Patient ID: Bradley Bernard, male    DOB: 1970-05-24  Age: 48 y.o. MRN: [id number]  CC:  Chief Complaint  Patient presents with  . Annual Exam    HPI Bradley Bernard presents for annual wellness exam.  He is doing well overall he is not currently exercising but plans to start.  He is actually be starting PT for his right knee after seeing Dr. Georgina Snell he does not think that just having orthoscopic surgery again will help he thinks that he is at the point of probably needing a knee replacement but wants him to try to hold out for the next couple years if at all possible.  He also wanted to discuss that he is having a little bit of difficulty with erectile dysfunction.  He is able to initiate an erection but is unable to have an orgasm and ejaculate.  It is become more frequent that this is actually happened.  He denies any difficulty with urination or frequent nocturia he says occasionally he might get up once in the middle the night.  He is planning on seeing his new neurologist his other neurologist retired he has had some problems and complications since his vasectomy with recurrent infections.  Past Medical History:  Diagnosis Date  . Asthma   . Seasonal allergies     Past Surgical History:  Procedure Laterality Date  . Right knee arthroscopic assisted anterior cruciate ligament tear with Achilles allograft. Right 06/20/2012   right knee meniscu tear  . Right knee medial and lateral meniscectomies Right 06/20/2012    Family History  Problem Relation Age of Onset  . Breast cancer Mother   . Hypertension Father     Social History   Socioeconomic History  . Marital status: Married    Spouse name: Brayton Layman  . Number of children: Not on file  . Years of education: Not on file  . Highest education level: Not on file  Occupational History  . Occupation: Corporate investment banker: [employer]  . Financial  resource strain: Not on file  . Food insecurity:    Worry: Not on file    Inability: Not on file  . Transportation needs:    Medical: Not on file    Non-medical: Not on file  Tobacco Use  . Smoking status: Former Smoker    Packs/day: 1.00    Years: 13.00    Pack years: 13.00  . Smokeless tobacco: Never Used  Substance and Sexual Activity  . Alcohol use: No  . Drug use: No  . Sexual activity: Yes    Partners: Female  Lifestyle  . Physical activity:    Days per week: Not on file    Minutes per session: Not on file  . Stress: Not on file  Relationships  . Social connections:    Talks on phone: Not on file    Gets together: Not on file    Attends religious service: Not on file    Active member of club or organization: Not on file    Attends meetings of clubs or organizations: Not on file    Relationship status: Not on file  . Intimate partner violence:    Fear of current or ex partner: Not on file    Emotionally abused: Not on file    Physically abused: Not on file    Forced sexual activity: Not on file  Other Topics Concern  .  Not on file  Social History Narrative  . Not on file    Outpatient Medications Prior to Visit  Medication Sig Dispense Refill  . diclofenac sodium (VOLTAREN) 1 % GEL Apply 4 g topically 4 (four) times daily. To affected joint. 100 g 11   No facility-administered medications prior to visit.     No Known Allergies  ROS Review of Systems    Objective:    Physical Exam  Constitutional: He is oriented to person, place, and time. He appears well-developed and well-nourished.  HENT:  Head: Normocephalic and atraumatic.  Right Ear: External ear normal.  Left Ear: External ear normal.  Nose: Nose normal.  Mouth/Throat: Oropharynx is clear and moist.  Eyes: Pupils are equal, round, and reactive to light. Conjunctivae and EOM are normal.  Neck: Normal range of motion. Neck supple. No thyromegaly present.  Cardiovascular: Normal rate, regular  rhythm, normal heart sounds and intact distal pulses.  Pulmonary/Chest: Effort normal and breath sounds normal.  Abdominal: Soft. Bowel sounds are normal. He exhibits no distension and no mass. There is no abdominal tenderness. There is no rebound and no guarding.  Musculoskeletal: Normal range of motion.  Lymphadenopathy:    He has no cervical adenopathy.  Neurological: He is alert and oriented to person, place, and time. He has normal reflexes.  Skin: Skin is warm and dry.  Psychiatric: He has a normal mood and affect. His behavior is normal. Judgment and thought content normal.    BP 138/78   Pulse 97   Ht 5\' 7"  (1.702 m)   Wt 202 lb (91.6 kg)   SpO2 96%   BMI 31.64 kg/m  Wt Readings from Last 3 Encounters:  04/21/18 202 lb (91.6 kg)  04/11/18 204 lb (92.5 kg)  01/21/18 204 lb (92.5 kg)     Health Maintenance Due  Topic Date Due  . HIV Screening  04/02/1984    There are no preventive care reminders to display for this patient.  Lab Results  Component Value Date   TSH 1.259 09/17/2007   Lab Results  Component Value Date   WBC 6.4 04/16/2018   HGB 15.8 04/16/2018   HCT 46.7 04/16/2018   MCV 87.0 04/16/2018   PLT 252 04/16/2018   Lab Results  Component Value Date   NA 139 04/16/2018   K 4.0 04/16/2018   CO2 24 04/16/2018   GLUCOSE 86 04/16/2018   BUN 12 04/16/2018   CREATININE 1.05 04/16/2018   BILITOT 0.5 04/16/2018   ALKPHOS 59 08/03/2016   AST 14 04/16/2018   ALT 18 04/16/2018   PROT 6.4 04/16/2018   ALBUMIN 4.2 08/03/2016   CALCIUM 9.2 04/16/2018   Lab Results  Component Value Date   CHOL 159 04/16/2018   Lab Results  Component Value Date   HDL 30 (L) 04/16/2018   Lab Results  Component Value Date   LDLCALC 109 (H) 04/16/2018   Lab Results  Component Value Date   TRIG 104 04/16/2018   Lab Results  Component Value Date   CHOLHDL 5.3 (H) 04/16/2018   No results found for: HGBA1C    Assessment & Plan:   Problem List Items Addressed  This Visit      Other   Erectile dysfunction    Other Visit Diagnoses    Wellness examination    -  Primary     complete physical examination  Keep up a regular exercise program and make sure you are eating a healthy diet Try to  eat 4 servings of dairy a day, or if you are lactose intolerant take a calcium with vitamin D daily.  Your vaccines are up to date.   Erectile dysfunction-it could be that he is actually having some retrograde ejaculation versus difficulty reaching orgasm is little difficult to tell.  His ED questionnaire score was 19 we discussed maybe a trial of low-dose sildenafil.  He says he is willing to try it warned about potential side effects.  Recent testosterone levels are normal. Will send rx   Meds ordered this encounter  Medications  . sildenafil (REVATIO) 20 MG tablet    Sig: Take 2-5 tablets (40-100 mg total) by mouth 3 (three) times daily.    Dispense:  60 tablet    Refill:  2    Follow-up: Return if symptoms worsen or fail to improve.    Beatrice Lecher, MD

## 2018-04-23 ENCOUNTER — Telehealth: Payer: Self-pay

## 2018-04-23 MED ORDER — SILDENAFIL CITRATE 20 MG PO TABS: 40.0000 mg | ORAL_TABLET | Freq: Three times a day (TID) | ORAL | 2 refills | Status: DC

## 2018-04-23 NOTE — Telephone Encounter (Signed)
Patient's wife advised

## 2018-04-23 NOTE — Telephone Encounter (Signed)
Spiro's wife called stating he never received the prescription for Viagra. Please advise.

## 2018-04-23 NOTE — Addendum Note (Signed)
Addended by: Beatrice Lecher D on: 04/23/2018 11:19 AM   Modules accepted: Orders

## 2018-04-23 NOTE — Telephone Encounter (Signed)
O apologize. Rx sent.

## 2018-06-27 DIAGNOSIS — H5203 Hypermetropia, bilateral: Secondary | ICD-10-CM | POA: Diagnosis not present

## 2018-12-31 ENCOUNTER — Ambulatory Visit (INDEPENDENT_AMBULATORY_CARE_PROVIDER_SITE_OTHER): Payer: BLUE CROSS/BLUE SHIELD | Admitting: Physician Assistant

## 2018-12-31 ENCOUNTER — Telehealth: Payer: Self-pay | Admitting: Family

## 2018-12-31 ENCOUNTER — Encounter: Payer: Self-pay | Admitting: Physician Assistant

## 2018-12-31 VITALS — BP 134/76 | Temp 98.2°F | Ht 67.0 in | Wt 198.0 lb

## 2018-12-31 DIAGNOSIS — B349 Viral infection, unspecified: Secondary | ICD-10-CM

## 2018-12-31 DIAGNOSIS — R05 Cough: Secondary | ICD-10-CM

## 2018-12-31 DIAGNOSIS — R0989 Other specified symptoms and signs involving the circulatory and respiratory systems: Secondary | ICD-10-CM

## 2018-12-31 DIAGNOSIS — R059 Cough, unspecified: Secondary | ICD-10-CM

## 2018-12-31 DIAGNOSIS — R0981 Nasal congestion: Secondary | ICD-10-CM

## 2018-12-31 MED ORDER — HYDROCOD POLST-CPM POLST ER 10-8 MG/5ML PO SUER: 5.0000 mL | Freq: Two times a day (BID) | ORAL | 0 refills | Status: DC | PRN

## 2018-12-31 MED ORDER — ALBUTEROL SULFATE HFA 108 (90 BASE) MCG/ACT IN AERS: 2.0000 | INHALATION_SPRAY | Freq: Four times a day (QID) | RESPIRATORY_TRACT | 0 refills | Status: DC | PRN

## 2018-12-31 MED ORDER — BENZONATATE 100 MG PO CAPS: 100.0000 mg | ORAL_CAPSULE | Freq: Three times a day (TID) | ORAL | 0 refills | Status: DC | PRN

## 2018-12-31 MED ORDER — PREDNISONE 50 MG PO TABS: ORAL_TABLET | ORAL | 0 refills | Status: DC
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
## 2018-12-31 NOTE — Progress Notes (Deleted)
Started yesterday: Cough (dry yesterday)/ now productive Congestion  No fever/chills, no body aches, no sore throat, no SOB, no change in taste/smell  He will check BP/Temp prior to visit and give information to [facility]

## 2018-12-31 NOTE — Progress Notes (Signed)
We are sorry that you are not feeling well.  Here is how we plan to help!  Based on your presentation I believe you most likely have A cough due to a virus.  This is called viral bronchitis and is best treated by rest, plenty of fluids and control of the cough.  You may use Ibuprofen or Tylenol as directed to help your symptoms.     In addition you may use A non-prescription cough medication called Robitussin DAC. Take 2 teaspoons every 8 hours or Delsym: take 2 teaspoons every 12 hours. and A prescription cough medication called Tessalon Perles 100mg . You may take 1-2 capsules every 8 hours as needed for your cough. I have also sent in albuterol inhaler that you will use 2 puffs when you are having shortness of breath or wheezing.   From your responses in the eVisit questionnaire you describe inflammation in the upper respiratory tract which is causing a significant cough.  This is commonly called Bronchitis and has four common causes:    Allergies  Viral Infections  Acid Reflux  Bacterial Infection Allergies, viruses and acid reflux are treated by controlling symptoms or eliminating the cause. An example might be a cough caused by taking certain blood pressure medications. You stop the cough by changing the medication. Another example might be a cough caused by acid reflux. Controlling the reflux helps control the cough.  USE OF BRONCHODILATOR ("RESCUE") INHALERS: There is a risk from using your bronchodilator too frequently.  The risk is that over-reliance on a medication which only relaxes the muscles surrounding the breathing tubes can reduce the effectiveness of medications prescribed to reduce swelling and congestion of the tubes themselves.  Although you feel brief relief from the bronchodilator inhaler, your asthma may actually be worsening with the tubes becoming more swollen and filled with mucus.  This can delay other crucial treatments, such as oral steroid medications. If you need to  use a bronchodilator inhaler daily, several times per day, you should discuss this with your provider.  There are probably better treatments that could be used to keep your asthma under control.     HOME CARE . Only take medications as instructed by your medical team. . Complete the entire course of an antibiotic. . Drink plenty of fluids and get plenty of rest. . Avoid close contacts especially the very young and the elderly . Cover your mouth if you cough or cough into your sleeve. . Always remember to wash your hands . A steam or ultrasonic humidifier can help congestion.   GET HELP RIGHT AWAY IF: . You develop worsening fever. . You become short of breath . You cough up blood. . Your symptoms persist after you have completed your treatment plan MAKE SURE YOU   Understand these instructions.  Will watch your condition.  Will get help right away if you are not doing well or get worse.  Your e-visit answers were reviewed by a board certified advanced clinical practitioner to complete your personal care plan.  Depending on the condition, your plan could have included both over the counter or prescription medications. If there is a problem please reply  once you have received a response from your provider. Your safety is important to [place].  If you have drug allergies check your prescription carefully.    You can use MyChart to ask questions about today's visit, request a non-urgent call back, or ask for a work or school excuse for 24 hours related to  this e-Visit. If it has been greater than 24 hours you will need to follow up with your provider, or enter a new e-Visit to address those concerns. You will get an e-mail in the next two days asking about your experience.  I hope that your e-visit has been valuable and will speed your recovery. Thank you for using e-visits.   Approximately 5 minutes was spent documenting and reviewing patient's chart.

## 2019-01-05 ENCOUNTER — Encounter: Payer: Self-pay | Admitting: Physician Assistant

## 2019-01-06 MED ORDER — AZITHROMYCIN 250 MG PO TABS: ORAL_TABLET | ORAL | 0 refills | Status: DC

## 2019-01-12 ENCOUNTER — Ambulatory Visit: Payer: BLUE CROSS/BLUE SHIELD | Admitting: Psychology

## 2019-07-01 ENCOUNTER — Ambulatory Visit: Payer: Medicaid Other | Admitting: Family Medicine

## 2019-07-07 ENCOUNTER — Ambulatory Visit: Payer: Medicaid Other | Admitting: Family Medicine

## 2019-07-07 NOTE — Progress Notes (Deleted)
Acute Office Visit  Subjective:    Patient ID: Bradley Bernard, male    DOB: 12/07/1970, 49 y.o.   MRN: [id number]  No chief complaint on file.   HPI Patient is in today for injury to finger.   Past Medical History:  Diagnosis Date  . Asthma   . Seasonal allergies     Past Surgical History:  Procedure Laterality Date  . Right knee arthroscopic assisted anterior cruciate ligament tear with Achilles allograft. Right 06/20/2012   right knee meniscu tear  . Right knee medial and lateral meniscectomies Right 06/20/2012    Family History  Problem Relation Age of Onset  . Breast cancer Mother   . Hypertension Father     Social History   Socioeconomic History  . Marital status: Married    Spouse name: Brayton Layman  . Number of children: Not on file  . Years of education: Not on file  . Highest education level: Not on file  Occupational History  . Occupation: Corporate investment banker: [employer]   Tobacco Use  . Smoking status: Former Smoker    Packs/day: 1.00    Years: 13.00    Pack years: 13.00  . Smokeless tobacco: Never Used  Substance and Sexual Activity  . Alcohol use: No  . Drug use: No  . Sexual activity: Yes    Partners: Female  Other Topics Concern  . Not on file  Social History Narrative  . Not on file   Social Determinants of Health   Financial Resource Strain:   . Difficulty of Paying Living Expenses:   Food Insecurity:   . Worried About Charity fundraiser in the Last Year:   . Arboriculturist in the Last Year:   Transportation Needs:   . Film/video editor (Medical):   Marland Kitchen Lack of Transportation (Non-Medical):   Physical Activity:   . Days of Exercise per Week:   . Minutes of Exercise per Session:   Stress:   . Feeling of Stress :   Social Connections:   . Frequency of Communication with Friends and Family:   . Frequency of Social Gatherings with Friends and Family:   . Attends Religious Services:   . Active Member of Clubs or  Organizations:   . Attends Archivist Meetings:   Marland Kitchen Marital Status:   Intimate Partner Violence:   . Fear of Current or Ex-Partner:   . Emotionally Abused:   Marland Kitchen Physically Abused:   . Sexually Abused:     Outpatient Medications Prior to Visit  Medication Sig Dispense Refill  . albuterol (VENTOLIN HFA) 108 (90 Base) MCG/ACT inhaler Inhale 2 puffs into the lungs every 6 (six) hours as needed for wheezing or shortness of breath. 18 g 0  . diclofenac sodium (VOLTAREN) 1 % GEL Apply 4 g topically 4 (four) times daily. To affected joint. 100 g 11  . sildenafil (REVATIO) 20 MG tablet Take 2-5 tablets (40-100 mg total) by mouth 3 (three) times daily. 60 tablet 2  . azithromycin (ZITHROMAX) 250 MG tablet Take 2 tablets now and then one tablet for 4 days. 6 tablet 0  . benzonatate (TESSALON PERLES) 100 MG capsule Take 1 capsule (100 mg total) by mouth 3 (three) times daily as needed. 20 capsule 0  . chlorpheniramine-HYDROcodone (TUSSIONEX) 10-8 MG/5ML SUER Take 5 mLs by mouth every 12 (twelve) hours as needed. 60 mL 0  . predniSONE (DELTASONE) 50 MG tablet One tab PO daily  for 5 days. 5 tablet 0   No facility-administered medications prior to visit.    No Known Allergies  Review of Systems     Objective:    Physical Exam  There were no vitals taken for this visit. Wt Readings from Last 3 Encounters:  12/31/18 198 lb (89.8 kg)  04/21/18 202 lb (91.6 kg)  04/11/18 204 lb (92.5 kg)    Health Maintenance Due  Topic Date Due  . HIV Screening  Never done  . COLONOSCOPY  Never done    There are no preventive care reminders to display for this patient.   Lab Results  Component Value Date   TSH 1.259 09/17/2007   Lab Results  Component Value Date   WBC 6.4 04/16/2018   HGB 15.8 04/16/2018   HCT 46.7 04/16/2018   MCV 87.0 04/16/2018   PLT 252 04/16/2018   Lab Results  Component Value Date   NA 139 04/16/2018   K 4.0 04/16/2018   CO2 24 04/16/2018   GLUCOSE 86  04/16/2018   BUN 12 04/16/2018   CREATININE 1.05 04/16/2018   BILITOT 0.5 04/16/2018   ALKPHOS 59 08/03/2016   AST 14 04/16/2018   ALT 18 04/16/2018   PROT 6.4 04/16/2018   ALBUMIN 4.2 08/03/2016   CALCIUM 9.2 04/16/2018   Lab Results  Component Value Date   CHOL 159 04/16/2018   Lab Results  Component Value Date   HDL 30 (L) 04/16/2018   Lab Results  Component Value Date   LDLCALC 109 (H) 04/16/2018   Lab Results  Component Value Date   TRIG 104 04/16/2018   Lab Results  Component Value Date   CHOLHDL 5.3 (H) 04/16/2018   No results found for: HGBA1C     Assessment & Plan:   Problem List Items Addressed This Visit    None       No orders of the defined types were placed in this encounter.    Beatrice Lecher, MD

## 2019-09-01 ENCOUNTER — Telehealth: Payer: Self-pay

## 2019-09-01 ENCOUNTER — Other Ambulatory Visit: Payer: Self-pay

## 2019-09-01 ENCOUNTER — Ambulatory Visit (INDEPENDENT_AMBULATORY_CARE_PROVIDER_SITE_OTHER): Payer: Self-pay | Admitting: Family Medicine

## 2019-09-01 ENCOUNTER — Encounter: Payer: Self-pay | Admitting: Family Medicine

## 2019-09-01 DIAGNOSIS — B9789 Other viral agents as the cause of diseases classified elsewhere: Secondary | ICD-10-CM | POA: Insufficient documentation

## 2019-09-01 DIAGNOSIS — J988 Other specified respiratory disorders: Secondary | ICD-10-CM

## 2019-09-01 MED ORDER — PREDNISONE 20 MG PO TABS: 20.0000 mg | ORAL_TABLET | Freq: Two times a day (BID) | ORAL | 0 refills | Status: AC

## 2019-09-01 MED ORDER — HYDROCODONE-HOMATROPINE 5-1.5 MG/5ML PO SYRP: 5.0000 mL | ORAL_SOLUTION | Freq: Three times a day (TID) | ORAL | 0 refills | Status: DC | PRN

## 2019-09-01 MED ORDER — PREDNISONE 20 MG PO TABS: 20.0000 mg | ORAL_TABLET | Freq: Two times a day (BID) | ORAL | 0 refills | Status: DC

## 2019-09-01 NOTE — Telephone Encounter (Signed)
Bradley Bernard called and states he needs a work note for the week.

## 2019-09-01 NOTE — Telephone Encounter (Signed)
Letter sent through mychart.

## 2019-09-01 NOTE — Patient Instructions (Signed)

## 2019-09-01 NOTE — Assessment & Plan Note (Addendum)
Symptoms consistent with viral etiology.  Having some increased wheezing as well.  Recommend continuation of inhaler as needed.  Continue supportive care.  Start prednisone burst and add hycodan syrup to help with evening cough symptoms.   Instructed to contact clinic if symptoms are not improving or for new or worsening symptoms

## 2019-09-01 NOTE — Progress Notes (Signed)
Bradley Bernard - 50 y.o. male MRN [id number]  Date of birth: [birth year]  Subjective Chief Complaint  Patient presents with  . Cough  . Nasal Congestion    HPI Bradley Bernard is a 49 y.o. male with history of asthma here today with complaint of congestion, cough and wheezing.  Cough is non-productive.  He reports that symptoms started a few days ago.  He denies fever, chills, chest pain, headache, body aches or GI symptoms.  He has had COVID vaccination.    ROS:  A comprehensive ROS was completed and negative except as noted per HPI  No Known Allergies  Past Medical History:  Diagnosis Date  . Asthma   . Seasonal allergies     Past Surgical History:  Procedure Laterality Date  . Right knee arthroscopic assisted anterior cruciate ligament tear with Achilles allograft. Right 06/20/2012   right knee meniscu tear  . Right knee medial and lateral meniscectomies Right 06/20/2012    Social History   Socioeconomic History  . Marital status: Married    Spouse name: Brayton Layman  . Number of children: Not on file  . Years of education: Not on file  . Highest education level: Not on file  Occupational History  . Occupation: Corporate investment banker: [employer]   Tobacco Use  . Smoking status: Former Smoker    Packs/day: 1.00    Years: 13.00    Pack years: 13.00  . Smokeless tobacco: Never Used  Vaping Use  . Vaping Use: Never used  Substance and Sexual Activity  . Alcohol use: No  . Drug use: No  . Sexual activity: Yes    Partners: Female  Other Topics Concern  . Not on file  Social History Narrative  . Not on file   Social Determinants of Health   Financial Resource Strain:   . Difficulty of Paying Living Expenses:   Food Insecurity:   . Worried About Charity fundraiser in the Last Year:   . Arboriculturist in the Last Year:   Transportation Needs:   . Film/video editor (Medical):   Marland Kitchen Lack of Transportation (Non-Medical):   Physical Activity:   .  Days of Exercise per Week:   . Minutes of Exercise per Session:   Stress:   . Feeling of Stress :   Social Connections:   . Frequency of Communication with Friends and Family:   . Frequency of Social Gatherings with Friends and Family:   . Attends Religious Services:   . Active Member of Clubs or Organizations:   . Attends Archivist Meetings:   Marland Kitchen Marital Status:     Family History  Problem Relation Age of Onset  . Breast cancer Mother   . Hypertension Father     Health Maintenance  Topic Date Due  . HIV Screening  Never done  . COLONOSCOPY  Never done  . INFLUENZA VACCINE  09/06/2019  . TETANUS/TDAP  08/04/2026  . COVID-19 Vaccine  Completed  . Hepatitis C Screening  Completed     ----------------------------------------------------------------------------------------------------------------------------------------------------------------------------------------------------------------- Physical Exam BP (!) 148/73 (BP Location: Left Arm, Patient Position: Sitting, Cuff Size: Large)   Pulse 91   Temp 97.7 F (36.5 C)   Ht 5' 6.93" (1.7 m)   Wt 200 lb (90.7 kg)   SpO2 97%   BMI 31.39 kg/m   Physical Exam Constitutional:      Appearance: Normal appearance.  HENT:     Head: Normocephalic and  atraumatic.  Eyes:     General: No scleral icterus. Cardiovascular:     Rate and Rhythm: Normal rate and regular rhythm.  Pulmonary:     Effort: Pulmonary effort is normal.     Breath sounds: Normal breath sounds. No wheezing.  Abdominal:     General: Abdomen is flat.  Neurological:     General: No focal deficit present.     Mental Status: He is alert.  Psychiatric:        Mood and Affect: Mood normal.        Behavior: Behavior normal.      ------------------------------------------------------------------------------------------------------------------------------------------------------------------------------------------------------------------- Assessment and Plan  Viral respiratory infection Symptoms consistent with viral etiology.  Having some increased wheezing as well.  Recommend continuation of inhaler as needed.  Continue supportive care.  Start prednisone burst and add hycodan syrup to help with evening cough symptoms.   Instructed to contact clinic if symptoms are not improving or for new or worsening symptoms   Meds ordered this encounter  Medications  . DISCONTD: predniSONE (DELTASONE) 20 MG tablet    Sig: Take 1 tablet (20 mg total) by mouth 2 (two) times daily with a meal for 5 days.    Dispense:  10 tablet    Refill:  0  . DISCONTD: HYDROcodone-homatropine (HYCODAN) 5-1.5 MG/5ML syrup    Sig: Take 5 mLs by mouth every 8 (eight) hours as needed for cough.    Dispense:  100 mL    Refill:  0  . HYDROcodone-homatropine (HYCODAN) 5-1.5 MG/5ML syrup    Sig: Take 5 mLs by mouth every 8 (eight) hours as needed for cough.    Dispense:  100 mL    Refill:  0  . predniSONE (DELTASONE) 20 MG tablet    Sig: Take 1 tablet (20 mg total) by mouth 2 (two) times daily with a meal for 5 days.    Dispense:  10 tablet    Refill:  0    No follow-ups on file.    This visit occurred during the SARS-CoV-2 public health emergency.  Safety protocols were in place, including screening questions prior to the visit, additional usage of staff PPE, and extensive cleaning of exam room while observing appropriate contact time as indicated for disinfecting solutions.

## 2019-09-02 NOTE — Telephone Encounter (Signed)
Patient's wife advised

## 2019-10-12 ENCOUNTER — Other Ambulatory Visit: Payer: Self-pay

## 2019-10-12 ENCOUNTER — Encounter: Payer: Self-pay | Admitting: Emergency Medicine

## 2019-10-12 ENCOUNTER — Emergency Department (INDEPENDENT_AMBULATORY_CARE_PROVIDER_SITE_OTHER)
Admission: EM | Admit: 2019-10-12 | Discharge: 2019-10-12 | Disposition: A | Discharge status: Home / Self Care | Payer: Managed Care, Other (non HMO) | Source: Home / Self Care

## 2019-10-12 DIAGNOSIS — Z20822 Contact with and (suspected) exposure to covid-19: Secondary | ICD-10-CM | POA: Diagnosis not present

## 2019-10-12 DIAGNOSIS — R0981 Nasal congestion: Secondary | ICD-10-CM

## 2019-10-12 NOTE — ED Triage Notes (Signed)
Patient c/o exposure to COVID about one week ago, stuffy nose, head congestion, headache, no cough, no fever, no body chills, body aches.  Patient is vaccinated.

## 2019-10-12 NOTE — Discharge Instructions (Signed)
  Due to concern for possibly having Covid-19, it is advised that you self-isolate at home until test results come back, usually 2-3 days.  If positive, it is recommended you stay isolated for at least 10 days after symptom onset and 24 hours after last fever without taking medication (whichever is longer).  If you MUST go out, please wear a mask at all times, limit contact with others.   You may take 500mg  acetaminophen every 4-6 hours or in combination with ibuprofen 400-600mg  every 6-8 hours as needed for pain, inflammation, and fever.  Be sure to well hydrated with clear liquids and get at least 8 hours of sleep at night, preferably more while sick.   Please follow up with family medicine in 1 week if needed.

## 2019-10-12 NOTE — ED Provider Notes (Signed)
[name]    CSN: [id number] Arrival date & time: 10/12/19  1245      History   Chief Complaint Chief Complaint  Patient presents with  . Covid Exposure    HPI Bradley Bernard is a 49 y.o. male.   HPI Bradley Bernard is a 49 y.o. male presenting to UC with c/o head congestion, mild generalized HA after close exposure to COVID 1 week ago.  Pt has been fully vaccinated since April/May 2021.  Pt denies fever, chills, cough, chest pain or SOB. No body aches.  Denies n/v/d. Pt reports driving to [place] to pick up his adult daughter who tested positive for COVID and drive her back home to [facility] where she has been quarantining in a hotel.  Pt's wife seen in UC earlier today, also vaccinated, tested positive for COVID.  Pt requesting testing to notify his work.   Past Medical History:  Diagnosis Date  . Asthma   . Seasonal allergies     Patient Active Problem List   Diagnosis Date Noted  . Viral respiratory infection 09/01/2019  . Erectile dysfunction 04/21/2018  . Bilateral low back pain with left-sided sciatica 04/13/2014  . Right shoulder pain 04/13/2014  . ASTHMA 11/20/2010  . POLYP, GALLBLADDER 02/02/2009  . OTHER SPECIFIED DISORDERS OF LIVER 09/17/2007  . ASTHMA UNSPECIFIED WITH EXACERBATION 04/03/2007  . ALLERGIC RHINITIS 02/12/2007  . GERD 02/12/2007  . LIPOMAS, MULTIPLE 11/22/2006  . Hyperlipidemia 11/22/2006    Past Surgical History:  Procedure Laterality Date  . Right knee arthroscopic assisted anterior cruciate ligament tear with Achilles allograft. Right 06/20/2012   right knee meniscu tear  . Right knee medial and lateral meniscectomies Right 06/20/2012       Home Medications    Prior to Admission medications   Medication Sig Start Date End Date Taking? Authorizing Provider  albuterol (VENTOLIN HFA) 108 (90 Base) MCG/ACT inhaler Inhale 2 puffs into the lungs every 6 (six) hours as needed for wheezing or shortness of breath. 12/31/18  Yes  Hawks, Christy A, FNP  diclofenac sodium (VOLTAREN) 1 % GEL Apply 4 g topically 4 (four) times daily. To affected joint. Patient not taking: Reported on 09/01/2019 04/11/18   Gregor Hams, MD  HYDROcodone-homatropine [facility]) 5-1.5 MG/5ML syrup Take 5 mLs by mouth every 8 (eight) hours as needed for cough. 09/01/19   Luetta Nutting, DO  sildenafil (REVATIO) 20 MG tablet Take 2-5 tablets (40-100 mg total) by mouth 3 (three) times daily. 04/23/18   Hali Marry, MD    Family History Family History  Problem Relation Age of Onset  . Breast cancer Mother   . Hypertension Father     Social History Social History   Tobacco Use  . Smoking status: Former Smoker    Packs/day: 1.00    Years: 13.00    Pack years: 13.00  . Smokeless tobacco: Never Used  Vaping Use  . Vaping Use: Never used  Substance Use Topics  . Alcohol use: No  . Drug use: No     Allergies   Patient has no known allergies.   Review of Systems Review of Systems  Constitutional: Negative for chills and fever.  HENT: Positive for congestion, postnasal drip and rhinorrhea. Negative for ear pain, sore throat, trouble swallowing and voice change.   Respiratory: Negative for cough and shortness of breath.   Cardiovascular: Negative for chest pain and palpitations.  Gastrointestinal: Negative for abdominal pain, diarrhea, nausea and vomiting.  Musculoskeletal: Negative for arthralgias,  back pain and myalgias.  Skin: Negative for rash.  Neurological: Positive for headaches. Negative for dizziness and light-headedness.  All other systems reviewed and are negative.    Physical Exam Triage Vital Signs ED Triage Vitals  Enc Vitals Group     BP 10/12/19 1007 124/81     Pulse Rate 10/12/19 1007 61     Resp --      Temp 10/12/19 1007 98.1 F (36.7 C)     Temp Source 10/12/19 1007 Oral     SpO2 10/12/19 1007 96 %     Weight --      Height --      Head Circumference --      Peak Flow --      Pain Score  10/12/19 1008 6     Pain Loc --      Pain Edu? --      Excl. in [place]? --    No data found.  Updated Vital Signs BP 124/81 (BP Location: Left Arm)   Pulse 61   Temp 98.1 F (36.7 C) (Oral)   Ht 5' 7.25" (1.708 m)   Wt 197 lb 14.4 oz (89.8 kg)   SpO2 96%   BMI 30.77 kg/m   Visual Acuity Right Eye Distance:   Left Eye Distance:   Bilateral Distance:    Right Eye Near:   Left Eye Near:    Bilateral Near:     Physical Exam Vitals and nursing note reviewed.  Constitutional:      General: He is not in acute distress.    Appearance: Normal appearance. He is well-developed. He is not ill-appearing, toxic-appearing or diaphoretic.  HENT:     Head: Normocephalic and atraumatic.     Right Ear: Tympanic membrane and ear canal normal.     Left Ear: Tympanic membrane and ear canal normal.     Nose: Nose normal.     Right Sinus: No maxillary sinus tenderness or frontal sinus tenderness.     Left Sinus: No maxillary sinus tenderness or frontal sinus tenderness.     Mouth/Throat:     Lips: Pink.     Mouth: Mucous membranes are moist.     Pharynx: Oropharynx is clear. Uvula midline.  Cardiovascular:     Rate and Rhythm: Normal rate and regular rhythm.  Pulmonary:     Effort: Pulmonary effort is normal. No respiratory distress.     Breath sounds: Normal breath sounds. No stridor. No wheezing, rhonchi or rales.  Musculoskeletal:        General: Normal range of motion.     Cervical back: Normal range of motion and neck supple. No tenderness.  Lymphadenopathy:     Cervical: No cervical adenopathy.  Skin:    General: Skin is warm and dry.  Neurological:     Mental Status: He is alert and oriented to person, place, and time.  Psychiatric:        Behavior: Behavior normal.      UC Treatments / Results  Labs (all labs ordered are listed, but only abnormal results are displayed) Labs Reviewed  NOVEL CORONAVIRUS, NAA   Narrative:    Performed at:  [address], [place], [place]  [id number] Lab Director: Rush Farmer MD, Phone:  [phone]    EKG   Radiology No results found.  Procedures Procedures (including critical care time)  Medications Ordered in UC Medications - No data to display  Initial Impression / Assessment and Plan /  UC Course  I have reviewed the triage vital signs and the nursing notes.  Pertinent labs & imaging results that were available during my care of the patient were reviewed by me and considered in my medical decision making (see chart for details).     Pt appears well, NAD No evidence of bacterial infection at this time COVID test sent to lab F/u with PCP as needed AVS and work note provided  Final Clinical Impressions(s) / UC Diagnoses   Final diagnoses:  Nasal congestion  Close exposure to COVID-19 virus     Discharge Instructions      Due to concern for possibly having Covid-19, it is advised that you self-isolate at home until test results come back, usually 2-3 days.  If positive, it is recommended you stay isolated for at least 10 days after symptom onset and 24 hours after last fever without taking medication (whichever is longer).  If you MUST go out, please wear a mask at all times, limit contact with others.   You may take 500mg  acetaminophen every 4-6 hours or in combination with ibuprofen 400-600mg  every 6-8 hours as needed for pain, inflammation, and fever.  Be sure to well hydrated with clear liquids and get at least 8 hours of sleep at night, preferably more while sick.   Please follow up with family medicine in 1 week if needed.     ED Prescriptions    None     PDMP not reviewed this encounter.   Noe Gens, [place] 10/15/19 [phone]

## 2019-10-14 LAB — NOVEL CORONAVIRUS, NAA: SARS-CoV-2, NAA: NOT DETECTED

## 2019-11-10 ENCOUNTER — Ambulatory Visit (INDEPENDENT_AMBULATORY_CARE_PROVIDER_SITE_OTHER): Payer: Managed Care, Other (non HMO) | Admitting: Sports Medicine

## 2019-11-10 ENCOUNTER — Other Ambulatory Visit: Payer: Self-pay

## 2019-11-10 DIAGNOSIS — M2351 Chronic instability of knee, right knee: Secondary | ICD-10-CM | POA: Insufficient documentation

## 2019-11-10 DIAGNOSIS — G8929 Other chronic pain: Secondary | ICD-10-CM | POA: Diagnosis not present

## 2019-11-10 DIAGNOSIS — Z9889 Other specified postprocedural states: Secondary | ICD-10-CM | POA: Diagnosis not present

## 2019-11-10 DIAGNOSIS — M5442 Lumbago with sciatica, left side: Secondary | ICD-10-CM | POA: Diagnosis not present

## 2019-11-10 MED ORDER — MELOXICAM 15 MG PO TABS: ORAL_TABLET | ORAL | 3 refills | Status: DC

## 2019-11-10 NOTE — Assessment & Plan Note (Addendum)
This is a pleasant 49 year old male, he has a history of an ACL reconstruction with cadaveric graft on the right back in 2014. Unfortunately he has been having some sensations of instability, a mild swelling. He has a positive Lachman sign today. My concern is that he has torn his ACL graft. Proceeding with MRI, he did have x-rays already that showed sequelae of the ACL reconstruction and osteoarthritis per Adding meloxicam, physical therapy as well focusing on hamstrings. Return to see me in 6 weeks.

## 2019-11-10 NOTE — Progress Notes (Signed)
    Procedures performed today:    None.  Independent interpretation of notes and tests performed by another provider:   None.  Brief History, Exam, Impression, and Recommendations:    History of repair of ACL This is a pleasant 49 year old male, he has a history of an ACL reconstruction with cadaveric graft on the right back in 2014. Unfortunately he has been having some sensations of instability, a mild swelling. He has a positive Lachman sign today. My concern is that he has torn his ACL graft. Proceeding with MRI, he did have x-rays already that showed sequelae of the ACL reconstruction and osteoarthritis per Adding meloxicam, physical therapy as well focusing on hamstrings. Return to see me in 6 weeks.  Bilateral low back pain with left-sided sciatica Also having low back pain, axial, likely lumbar spinal stenosis with left-sided L4 radiculitis. Adding formal PT, meloxicam per Return to see me in 6 weeks, MRI for interventional planning if no better.    ___________________________________________ Gwen Her. Dianah Field, M.D., ABFM., CAQSM. Primary Care and [place] Instructor of [employer] [facility] [employer] [facility] [employer]

## 2019-11-10 NOTE — Assessment & Plan Note (Signed)
Also having low back pain, axial, likely lumbar spinal stenosis with left-sided L4 radiculitis. Adding formal PT, meloxicam per Return to see me in 6 weeks, MRI for interventional planning if no better.

## 2019-11-12 NOTE — Telephone Encounter (Signed)
Sent referral to [facility] as patient requested. - CF

## 2019-11-12 NOTE — Telephone Encounter (Signed)
Bradley Bernard - pt provided the preferred location information.

## 2019-11-13 NOTE — Telephone Encounter (Signed)
Note written and in system, thank you everyone.

## 2019-11-14 ENCOUNTER — Ambulatory Visit (INDEPENDENT_AMBULATORY_CARE_PROVIDER_SITE_OTHER): Payer: Managed Care, Other (non HMO)

## 2019-11-14 ENCOUNTER — Other Ambulatory Visit: Payer: Self-pay

## 2019-11-14 DIAGNOSIS — S83241A Other tear of medial meniscus, current injury, right knee, initial encounter: Secondary | ICD-10-CM | POA: Diagnosis not present

## 2019-11-14 DIAGNOSIS — S83511A Sprain of anterior cruciate ligament of right knee, initial encounter: Secondary | ICD-10-CM

## 2019-11-14 DIAGNOSIS — S83281A Other tear of lateral meniscus, current injury, right knee, initial encounter: Secondary | ICD-10-CM

## 2019-11-16 NOTE — Telephone Encounter (Signed)
Yes that is fine

## 2019-11-17 ENCOUNTER — Telehealth: Payer: Self-pay | Admitting: Family Medicine

## 2019-11-17 ENCOUNTER — Encounter: Payer: Self-pay | Admitting: *Deleted

## 2019-11-17 NOTE — Telephone Encounter (Signed)
Patient came in and was waiting for his wife to get done with an appointment and was questioning if he could have a work note wrote to return back to work tomorrow, and get it while he is here with his wife.

## 2019-11-17 NOTE — Telephone Encounter (Signed)
New letter written and placed with front office.  LMOM notifying pt.

## 2019-11-18 NOTE — Telephone Encounter (Signed)
Please contact Ryan to see if we can get him set up for this brace.  [email or web address]  Thank you!

## 2019-11-26 NOTE — Telephone Encounter (Signed)
Bradley Bernard is ordering this brace and will contact the pt to meet for the brace to be fitted.

## 2019-12-22 ENCOUNTER — Ambulatory Visit: Payer: Managed Care, Other (non HMO) | Admitting: Sports Medicine

## 2020-01-31 ENCOUNTER — Encounter: Payer: Self-pay | Admitting: Family Medicine

## 2020-01-31 ENCOUNTER — Other Ambulatory Visit: Payer: Self-pay

## 2020-01-31 ENCOUNTER — Emergency Department (INDEPENDENT_AMBULATORY_CARE_PROVIDER_SITE_OTHER)
Admission: EM | Admit: 2020-01-31 | Discharge: 2020-01-31 | Disposition: A | Discharge status: Home / Self Care | Payer: Managed Care, Other (non HMO) | Source: Home / Self Care

## 2020-01-31 DIAGNOSIS — J4521 Mild intermittent asthma with (acute) exacerbation: Secondary | ICD-10-CM | POA: Diagnosis not present

## 2020-01-31 DIAGNOSIS — B9789 Other viral agents as the cause of diseases classified elsewhere: Secondary | ICD-10-CM | POA: Diagnosis not present

## 2020-01-31 DIAGNOSIS — J069 Acute upper respiratory infection, unspecified: Secondary | ICD-10-CM | POA: Diagnosis not present

## 2020-01-31 MED ORDER — HYDROCOD POLST-CPM POLST ER 10-8 MG/5ML PO SUER: 5.0000 mL | Freq: Two times a day (BID) | ORAL | 0 refills | Status: DC | PRN

## 2020-01-31 MED ORDER — PREDNISONE 20 MG PO TABS: ORAL_TABLET | ORAL | 1 refills | Status: DC

## 2020-01-31 NOTE — Discharge Instructions (Addendum)
We are running the nasal swab test for Covid, RSV, and flu.  Results should be back tomorrow night or Tuesday at the latest.  In the meantime, we will give you symptomatic treatment for what we believe is an exacerbation of your asthma and a viral infection

## 2020-01-31 NOTE — ED Provider Notes (Signed)
[name]    CSN: [id number] Arrival date & time: 01/31/20  [id number]      History   Chief Complaint Chief Complaint  Patient presents with  . Nasal Congestion  . Cough  . Headache    HPI Bradley Bernard is a 49 y.o. male.   This is an established [place] urgent care patient who presents with nasal congestion and rhinorrhea.  Pt presents with 3 days of coughing, sinus headache, post nasal drip and congestion. His granddaughter had a viral infection and he was in contact with her. Of note, she tested negative for flu, covid and strep. OTC mucinex and home prn albuterol. Pt is vaccinated for covid but not flu.   Patient had some diaphoresis 2 nights ago but otherwise has had no signs of a fever.  His cough kept him awake most of the night.     Past Medical History:  Diagnosis Date  . Asthma   . Seasonal allergies     Patient Active Problem List   Diagnosis Date Noted  . History of repair of ACL 11/10/2019  . Viral respiratory infection 09/01/2019  . Erectile dysfunction 04/21/2018  . Bilateral low back pain with left-sided sciatica 04/13/2014  . Right shoulder pain 04/13/2014  . ASTHMA 11/20/2010  . POLYP, GALLBLADDER 02/02/2009  . OTHER SPECIFIED DISORDERS OF LIVER 09/17/2007  . ASTHMA UNSPECIFIED WITH EXACERBATION 04/03/2007  . ALLERGIC RHINITIS 02/12/2007  . GERD 02/12/2007  . LIPOMAS, MULTIPLE 11/22/2006  . Hyperlipidemia 11/22/2006    Past Surgical History:  Procedure Laterality Date  . Right knee arthroscopic assisted anterior cruciate ligament tear with Achilles allograft. Right 06/20/2012   right knee meniscu tear  . Right knee medial and lateral meniscectomies Right 06/20/2012       Home Medications    Prior to Admission medications   Medication Sig Start Date End Date Taking? Authorizing Provider  albuterol (VENTOLIN HFA) 108 (90 Base) MCG/ACT inhaler Inhale 2 puffs into the lungs every 6 (six) hours as needed for wheezing or  shortness of breath. 12/31/18   Sharion Balloon, FNP  chlorpheniramine-HYDROcodone (TUSSIONEX PENNKINETIC ER) 10-8 MG/5ML SUER Take 5 mLs by mouth every 12 (twelve) hours as needed for cough. 01/31/20   Robyn Haber, MD  predniSONE (DELTASONE) 20 MG tablet 2 daily with food 01/31/20   Robyn Haber, MD    Family History Family History  Problem Relation Age of Onset  . Breast cancer Mother   . Hypertension Father     Social History Social History   Tobacco Use  . Smoking status: Former Smoker    Packs/day: 1.00    Years: 13.00    Pack years: 13.00  . Smokeless tobacco: Never Used  Vaping Use  . Vaping Use: Never used  Substance Use Topics  . Alcohol use: No  . Drug use: No     Allergies   Patient has no known allergies.   Review of Systems Review of Systems  Constitutional: Positive for diaphoresis.  HENT: Positive for congestion and rhinorrhea.   Respiratory: Positive for cough.      Physical Exam Triage Vital Signs ED Triage Vitals [01/31/20 0831]  Enc Vitals Group     BP      Pulse      Resp      Temp      Temp src      SpO2      Weight 198 lb (89.8 kg)     Height 5\' 7"  (1.702 m)  Head Circumference      Peak Flow      Pain Score      Pain Loc      Pain Edu?      Excl. in [place]?    No data found.  Updated Vital Signs BP 119/70 (BP Location: Left Arm)   Pulse 70   Temp (!) 97.5 F (36.4 C) (Axillary)   Resp 18   Ht 5\' 7"  (1.702 m)   Wt 89.8 kg   SpO2 96%   BMI 31.01 kg/m    Physical Exam Vitals and nursing note reviewed.  Constitutional:      Appearance: He is well-developed. He is obese. He is not ill-appearing.  HENT:     Mouth/Throat:     Mouth: Mucous membranes are moist.  Cardiovascular:     Rate and Rhythm: Normal rate and regular rhythm.     Heart sounds: Normal heart sounds.  Pulmonary:     Effort: Pulmonary effort is normal.     Breath sounds: Wheezing present.  Musculoskeletal:        General: Normal range of  motion.  Skin:    General: Skin is warm and dry.  Neurological:     Mental Status: He is alert.     Cranial Nerves: No cranial nerve deficit.      UC Treatments / Results  Labs (all labs ordered are listed, but only abnormal results are displayed) Labs Reviewed  COVID-19, FLU A+B AND RSV    EKG   Radiology No results found.  Procedures Procedures (including critical care time)  Medications Ordered in UC Medications - No data to display  Initial Impression / Assessment and Plan / UC Course  I have reviewed the triage vital signs and the nursing notes.  Pertinent labs & imaging results that were available during my care of the patient were reviewed by me and considered in my medical decision making (see chart for details).    Final Clinical Impressions(s) / UC Diagnoses   Final diagnoses:  Viral respiratory infection  Acute upper respiratory infection  Mild intermittent asthma with acute exacerbation     Discharge Instructions     We are running the nasal swab test for Covid, RSV, and flu.  Results should be back tomorrow night or Tuesday at the latest.  In the meantime, we will give you symptomatic treatment for what we believe is an exacerbation of your asthma and a viral infection    ED Prescriptions    Medication Sig Dispense Auth. Provider   predniSONE (DELTASONE) 20 MG tablet 2 daily with food 10 tablet Robyn Haber, MD   chlorpheniramine-HYDROcodone (TUSSIONEX PENNKINETIC ER) 10-8 MG/5ML SUER Take 5 mLs by mouth every 12 (twelve) hours as needed for cough. 115 mL Robyn Haber, MD     I have reviewed the PDMP during this encounter.   Robyn Haber, MD 01/31/20 [phone]

## 2020-01-31 NOTE — ED Triage Notes (Signed)
Pt presents with 3 days of coughing, sinus headache, post nasal drip and congestion. His granddaughter had a viral infection and he was in contact with her. Of note, she tested negative for flu, covid and strep. OTC mucinex and home prn albuterol. Pt is vaccinated for covid but not flu.

## 2020-02-02 ENCOUNTER — Telehealth: Payer: Self-pay | Admitting: Family Medicine

## 2020-02-02 ENCOUNTER — Telehealth: Payer: Self-pay

## 2020-02-02 DIAGNOSIS — R059 Cough, unspecified: Secondary | ICD-10-CM

## 2020-02-02 LAB — COVID-19, FLU A+B AND RSV
Influenza A, NAA: NOT DETECTED
Influenza B, NAA: NOT DETECTED
RSV, NAA: NOT DETECTED
SARS-CoV-2, NAA: NOT DETECTED

## 2020-02-02 MED ORDER — ALBUTEROL SULFATE HFA 108 (90 BASE) MCG/ACT IN AERS: 2.0000 | INHALATION_SPRAY | Freq: Four times a day (QID) | RESPIRATORY_TRACT | 0 refills | Status: DC | PRN

## 2020-02-02 NOTE — Telephone Encounter (Signed)
Patient needs refill on albuterol for nebulizing machine

## 2020-02-02 NOTE — Telephone Encounter (Signed)
Pt left VM re being out of nebulizing tx. Notified nebulizing solution has been sent in for refill.

## 2020-02-03 ENCOUNTER — Emergency Department (INDEPENDENT_AMBULATORY_CARE_PROVIDER_SITE_OTHER): Payer: Managed Care, Other (non HMO)

## 2020-02-03 ENCOUNTER — Other Ambulatory Visit: Payer: Self-pay

## 2020-02-03 ENCOUNTER — Emergency Department (INDEPENDENT_AMBULATORY_CARE_PROVIDER_SITE_OTHER)
Admission: EM | Admit: 2020-02-03 | Discharge: 2020-02-03 | Disposition: A | Discharge status: Home / Self Care | Payer: Managed Care, Other (non HMO) | Source: Home / Self Care

## 2020-02-03 DIAGNOSIS — R0789 Other chest pain: Secondary | ICD-10-CM

## 2020-02-03 DIAGNOSIS — W19XXXA Unspecified fall, initial encounter: Secondary | ICD-10-CM | POA: Diagnosis not present

## 2020-02-03 DIAGNOSIS — R0781 Pleurodynia: Secondary | ICD-10-CM | POA: Diagnosis not present

## 2020-02-03 DIAGNOSIS — R059 Cough, unspecified: Secondary | ICD-10-CM | POA: Diagnosis not present

## 2020-02-03 NOTE — Discharge Instructions (Addendum)
X-rays do not show a rib fracture.  At this point I think you need to continue your current medications only

## 2020-02-03 NOTE — ED Triage Notes (Signed)
Pt c/o RT rib pain and back pain after falling around noon yesterday. Says his knee gave our and he fell hitting his side and back on baseboards. Also still having issues with cough, says hes tried mucinex with no relief. Tested neg for covid. Tylenol yesterday with little relief.

## 2020-02-03 NOTE — ED Provider Notes (Signed)
[name]    CSN: [id number] Arrival date & time: 02/03/20  1031      History   Chief Complaint Chief Complaint  Patient presents with  . Chest Pain  . Back Pain    HPI Bradley Bernard is a 49 y.o. male.   Established patient seen here last week  Pt c/o RT rib pain and back pain after falling around noon yesterday. Says his knee gave our and he fell hitting his side and back on baseboards. Also still having issues with cough, says hes tried mucinex with no relief. Tested neg for covid. Tylenol yesterday with little relief.     Past Medical History:  Diagnosis Date  . Asthma   . Seasonal allergies     Patient Active Problem List   Diagnosis Date Noted  . History of repair of ACL 11/10/2019  . Viral respiratory infection 09/01/2019  . Erectile dysfunction 04/21/2018  . Bilateral low back pain with left-sided sciatica 04/13/2014  . Right shoulder pain 04/13/2014  . ASTHMA 11/20/2010  . POLYP, GALLBLADDER 02/02/2009  . OTHER SPECIFIED DISORDERS OF LIVER 09/17/2007  . ASTHMA UNSPECIFIED WITH EXACERBATION 04/03/2007  . ALLERGIC RHINITIS 02/12/2007  . GERD 02/12/2007  . LIPOMAS, MULTIPLE 11/22/2006  . Hyperlipidemia 11/22/2006    Past Surgical History:  Procedure Laterality Date  . Right knee arthroscopic assisted anterior cruciate ligament tear with Achilles allograft. Right 06/20/2012   right knee meniscu tear  . Right knee medial and lateral meniscectomies Right 06/20/2012       Home Medications    Prior to Admission medications   Medication Sig Start Date End Date Taking? Authorizing Provider  albuterol (VENTOLIN HFA) 108 (90 Base) MCG/ACT inhaler Inhale 2 puffs into the lungs every 6 (six) hours as needed for wheezing or shortness of breath. 02/02/20   Robyn Haber, MD  chlorpheniramine-HYDROcodone (TUSSIONEX PENNKINETIC ER) 10-8 MG/5ML SUER Take 5 mLs by mouth every 12 (twelve) hours as needed for cough. 01/31/20   Robyn Haber,  MD  predniSONE (DELTASONE) 20 MG tablet 2 daily with food 01/31/20   Robyn Haber, MD    Family History Family History  Problem Relation Age of Onset  . Breast cancer Mother   . Hypertension Father     Social History Social History   Tobacco Use  . Smoking status: Former Smoker    Packs/day: 1.00    Years: 13.00    Pack years: 13.00  . Smokeless tobacco: Never Used  Vaping Use  . Vaping Use: Never used  Substance Use Topics  . Alcohol use: No  . Drug use: No     Allergies   Patient has no known allergies.   Review of Systems Review of Systems  Constitutional: Positive for fatigue.  Respiratory: Positive for cough.   Cardiovascular: Positive for chest pain.     Physical Exam Triage Vital Signs ED Triage Vitals  Enc Vitals Group     BP 02/03/20 1047 (!) 145/83     Pulse Rate 02/03/20 1047 68     Resp 02/03/20 1047 18     Temp 02/03/20 1047 97.6 F (36.4 C)     Temp Source 02/03/20 1047 Oral     SpO2 02/03/20 1047 96 %     Weight --      Height --      Head Circumference --      Peak Flow --      Pain Score 02/03/20 1049 6     Pain Loc --  Pain Edu? --      Excl. in [place]? --    No data found.  Updated Vital Signs BP (!) 145/83 (BP Location: Right Arm)   Pulse 68   Temp 97.6 F (36.4 C) (Oral)   Resp 18   SpO2 96%    Physical Exam Vitals and nursing note reviewed.  Constitutional:      Appearance: He is well-developed. He is obese. He is not ill-appearing.  Cardiovascular:     Rate and Rhythm: Normal rate.     Heart sounds: Normal heart sounds.  Pulmonary:     Breath sounds: Normal breath sounds.  Musculoskeletal:        General: Normal range of motion.     Cervical back: Normal range of motion and neck supple.  Skin:    General: Skin is warm.     Findings: Ecchymosis present.     Comments: Bruising seen right posterior axillary line, mid chest  Neurological:     General: No focal deficit present.     Mental Status: He is  alert.  Psychiatric:        Mood and Affect: Mood normal.      UC Treatments / Results  Labs (all labs ordered are listed, but only abnormal results are displayed) Labs Reviewed - No data to display  EKG   Radiology No results found.  Procedures Procedures (including critical care time)  Medications Ordered in UC Medications - No data to display  Initial Impression / Assessment and Plan / UC Course  I have reviewed the triage vital signs and the nursing notes.  Pertinent labs & imaging results that were available during my care of the patient were reviewed by me and considered in my medical decision making (see chart for details).    Final Clinical Impressions(s) / UC Diagnoses   Final diagnoses:  None   Discharge Instructions   None    ED Prescriptions    None     PDMP not reviewed this encounter.   Elvina Sidle, MD 02/03/20 1118

## 2020-02-09 ENCOUNTER — Ambulatory Visit (INDEPENDENT_AMBULATORY_CARE_PROVIDER_SITE_OTHER): Payer: Managed Care, Other (non HMO) | Admitting: Family Medicine

## 2020-02-09 ENCOUNTER — Other Ambulatory Visit: Payer: Self-pay

## 2020-02-09 ENCOUNTER — Encounter: Payer: Self-pay | Admitting: Family Medicine

## 2020-02-09 VITALS — BP 112/66 | HR 72 | Wt 200.0 lb

## 2020-02-09 DIAGNOSIS — Z820 Family history of epilepsy and other diseases of the nervous system: Secondary | ICD-10-CM

## 2020-02-09 DIAGNOSIS — J0110 Acute frontal sinusitis, unspecified: Secondary | ICD-10-CM | POA: Diagnosis not present

## 2020-02-09 MED ORDER — AMOXICILLIN-POT CLAVULANATE 875-125 MG PO TABS: 1.0000 | ORAL_TABLET | Freq: Two times a day (BID) | ORAL | 0 refills | Status: DC

## 2020-02-09 NOTE — Progress Notes (Signed)
Acute Office Visit  Subjective:    Patient ID: Bradley Bernard, male    DOB: 1970/06/03, 50 y.o.   MRN: [id number]  No chief complaint on file.   HPI Patient is in today for sinus congestion and cough for 13 days.  In fact he to urgent care after 3 days of illness on December 26 and was diagnosed with viral respiratory infection.  He was given some prednisone and an inhaler.  He says he just does not feel any better he is having a lot of sinus congestion and pressure over the eyebrow ridge.  No fever or chills.  He has not had any GI symptoms.  Sore throat, + post nasal drip. No ear pain.  Had a negative Covid test, negative RSV, and negative for influenza.\  He also let me know that he recently brought his parents to the local area.  His father has been diagnosed with Alzheimer's.  It is to the point where he sometimes does not know his son's name.  Past Medical History:  Diagnosis Date  . Asthma   . Seasonal allergies     Past Surgical History:  Procedure Laterality Date  . Right knee arthroscopic assisted anterior cruciate ligament tear with Achilles allograft. Right 06/20/2012   right knee meniscu tear  . Right knee medial and lateral meniscectomies Right 06/20/2012    Family History  Problem Relation Age of Onset  . Breast cancer Mother   . Hypertension Father     Social History   Socioeconomic History  . Marital status: Married    Spouse name: Bradley Bernard  . Number of children: Not on file  . Years of education: Not on file  . Highest education level: Not on file  Occupational History  . 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 Smoking status: Former Smoker    Packs/day: 1.00    Years: 13.00    Pack years: 13.00  . Smokeless tobacco: Never Used  Vaping Use  . Vaping Use: Never used  Substance and Sexual Activity  . Alcohol use: No  . Drug use: No  . Sexual activity: Yes    Partners: Female  Other Topics Concern  . Not on file   Social History Narrative  . Not on file   Social Determinants of Health   Financial Resource Strain: Not on file  Food Insecurity: Not on file  Transportation Needs: Not on file  Physical Activity: Not on file  Stress: Not on file  Social Connections: Not on file  Intimate Partner Violence: Not on file    Outpatient Medications Prior to Visit  Medication Sig Dispense Refill  . albuterol (VENTOLIN HFA) 108 (90 Base) MCG/ACT inhaler Inhale 2 puffs into the lungs every 6 (six) hours as needed for wheezing or shortness of breath. 18 g 0  . chlorpheniramine-HYDROcodone (TUSSIONEX PENNKINETIC ER) 10-8 MG/5ML SUER Take 5 mLs by mouth every 12 (twelve) hours as needed for cough. 115 mL 0  . predniSONE (DELTASONE) 20 MG tablet 2 daily with food 10 tablet 1   No facility-administered medications prior to visit.    No Known Allergies  Review of Systems     Objective:    Physical Exam Constitutional:      Appearance: He is well-developed and well-nourished.  HENT:     Head: Normocephalic and atraumatic.     Right Ear: External ear normal.     Left Ear: External ear normal.  Nose: Nose normal.     Mouth/Throat:     Mouth: Oropharynx is clear and moist.      Comments: TMs and canals are clear. Eyes:     Extraocular Movements: EOM normal.     Conjunctiva/sclera: Conjunctivae normal.     Pupils: Pupils are equal, round, and reactive to light.  Neck:     Thyroid: No thyromegaly.  Cardiovascular:     Rate and Rhythm: Normal rate.     Heart sounds: Normal heart sounds.  Pulmonary:     Effort: Pulmonary effort is normal.     Breath sounds: Normal breath sounds.  Musculoskeletal:     Cervical back: Neck supple.  Lymphadenopathy:     Cervical: No cervical adenopathy.  Skin:    General: Skin is warm and dry.  Neurological:     Mental Status: He is alert and oriented to person, place, and time.  Psychiatric:        Mood and Affect: Mood and affect normal.     BP 112/66    Pulse 72   Wt 200 lb (90.7 kg)   SpO2 96%   BMI 31.32 kg/m  Wt Readings from Last 3 Encounters:  02/09/20 200 lb (90.7 kg)  01/31/20 198 lb (89.8 kg)  10/12/19 197 lb 14.4 oz (89.8 kg)    Health Maintenance Due  Topic Date Due  . HIV Screening  Never done  . COLONOSCOPY (Pts 45-28yrs Insurance coverage will need to be confirmed)  Never done  . INFLUENZA VACCINE  09/06/2019  . COVID-19 Vaccine (3 - Booster for Pfizer series) 12/12/2019    There are no preventive care reminders to display for this patient.   Lab Results  Component Value Date   TSH 1.259 09/17/2007   Lab Results  Component Value Date   WBC 6.4 04/16/2018   HGB 15.8 04/16/2018   HCT 46.7 04/16/2018   MCV 87.0 04/16/2018   PLT 252 04/16/2018   Lab Results  Component Value Date   NA 139 04/16/2018   K 4.0 04/16/2018   CO2 24 04/16/2018   GLUCOSE 86 04/16/2018   BUN 12 04/16/2018   CREATININE 1.05 04/16/2018   BILITOT 0.5 04/16/2018   ALKPHOS 59 08/03/2016   AST 14 04/16/2018   ALT 18 04/16/2018   PROT 6.4 04/16/2018   ALBUMIN 4.2 08/03/2016   CALCIUM 9.2 04/16/2018   Lab Results  Component Value Date   CHOL 159 04/16/2018   Lab Results  Component Value Date   HDL 30 (L) 04/16/2018   Lab Results  Component Value Date   LDLCALC 109 (H) 04/16/2018   Lab Results  Component Value Date   TRIG 104 04/16/2018   Lab Results  Component Value Date   CHOLHDL 5.3 (H) 04/16/2018   No results found for: HGBA1C     Assessment & Plan:   Problem List Items Addressed This Visit      Other   Family history of Alzheimer's disease    Other Visit Diagnoses    Acute non-recurrent frontal sinusitis    -  Primary   Relevant Medications   amoxicillin-clavulanate (AUGMENTIN) 875-125 MG tablet     Acute sinusitis - will tx with Augmentin.  Continue symptomatic care.  Call if not better in one week.   Mild history of Alzheimer's dementia-he did have some questions about dementia as well as  genetic testing.  Given book called the 36-hour day to share with his family.   Meds ordered this  encounter  Medications  . amoxicillin-clavulanate (AUGMENTIN) 875-125 MG tablet    Sig: Take 1 tablet by mouth 2 (two) times daily.    Dispense:  14 tablet    Refill:  0     Nani Gasser, MD

## 2020-02-11 ENCOUNTER — Encounter: Payer: Self-pay | Admitting: Family Medicine

## 2020-02-12 NOTE — Telephone Encounter (Signed)
Okay for work note till Monday.

## 2020-04-01 ENCOUNTER — Telehealth: Payer: Self-pay | Admitting: Family Medicine

## 2020-04-01 NOTE — Telephone Encounter (Signed)
Message regarding colon cancer screening.

## 2020-12-22 IMAGING — MR MR KNEE*R* W/O CM
7 series · 40 of 40 positions shown · non-contrast
Comparison: Plain films left knee 04/11/2018.

CLINICAL DATA: Chronic left knee instability. History of prior ACL
repair. No recent injury.

EXAM:
MRI OF THE RIGHT KNEE WITHOUT CONTRAST
TECHNIQUE: Multiplanar, multisequence MR imaging of the knee was performed. No
intravenous contrast was administered.

[Series 3: T2 fat-sat · axial · 4.0mm · 0.53mm/px · z∈[-110,+60]mm · 6 of 35 slices shown (1 of 3)]
[im 1/35]
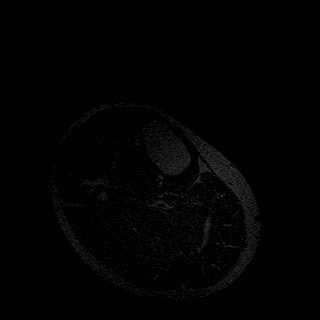
[im 7/35]
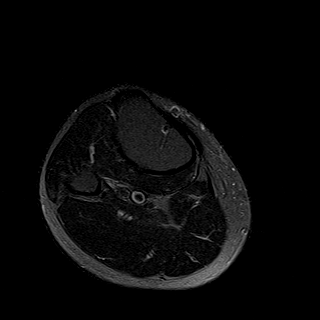
[im 14/35]
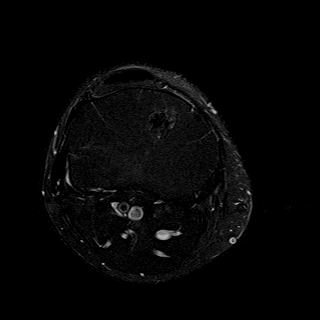
[im 21/35]
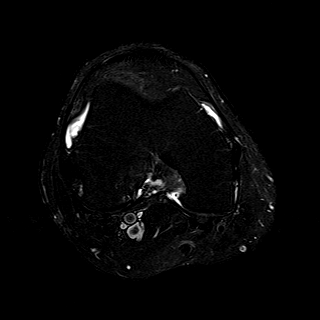
[im 28/35]
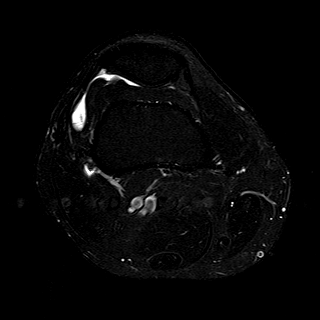
[im 35/35]
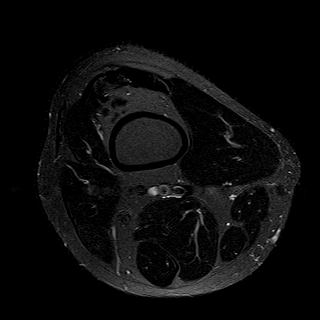

[Series 4: T1 · coronal · 4.0mm · 0.62mm/px · 6 of 29 slices shown]
[im 1/29]
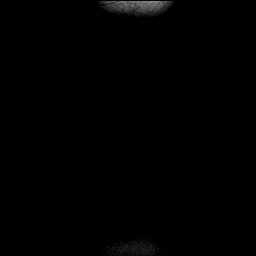
[im 6/29]
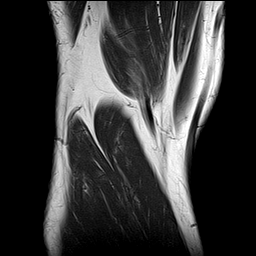
[im 12/29]
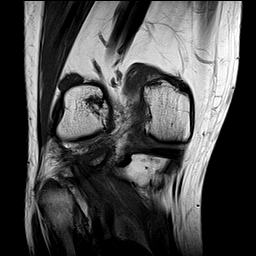
[im 17/29]
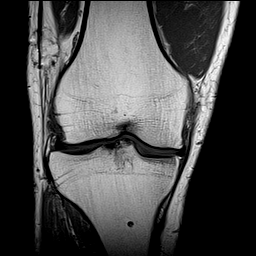
[im 23/29]
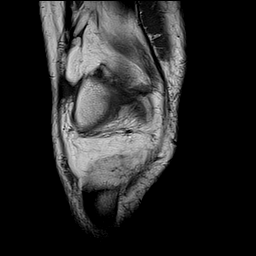
[im 29/29]
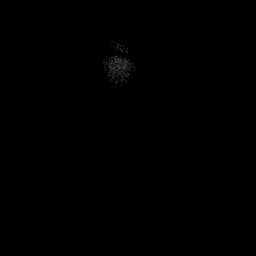

[Series 5: T2 fat-sat · coronal · 4.0mm · 0.62mm/px · 6 of 29 slices shown (2 of 3)]
[im 1/29]
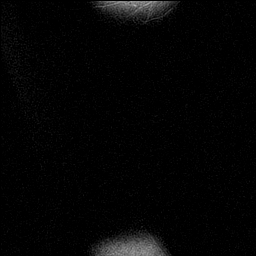
[im 6/29]
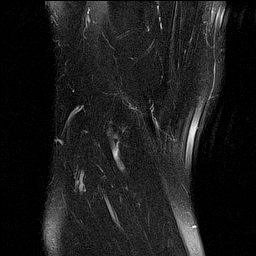
[im 12/29]
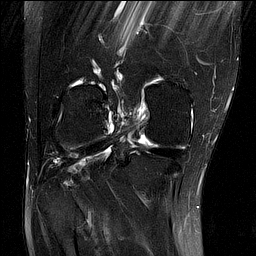
[im 17/29]
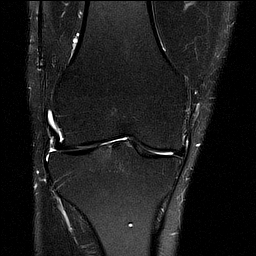
[im 23/29]
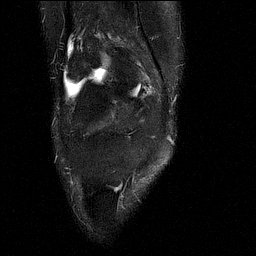
[im 29/29]
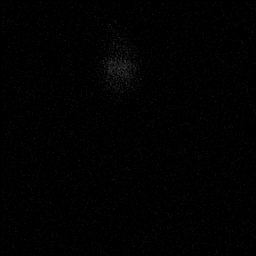

[Series 6: PD fat-sat · coronal · 4.0mm · 0.62mm/px · 6 of 29 slices shown (1 of 3)]
[im 1/29]
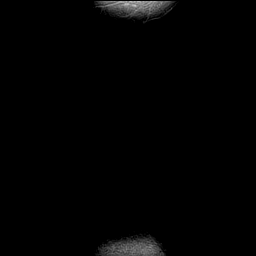
[im 6/29]
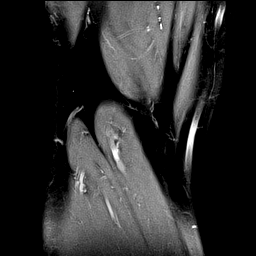
[im 12/29]
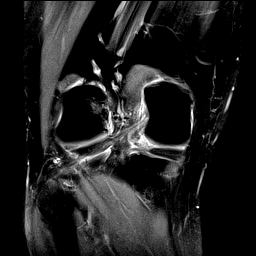
[im 17/29]
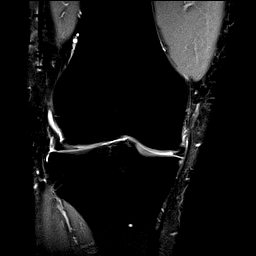
[im 23/29]
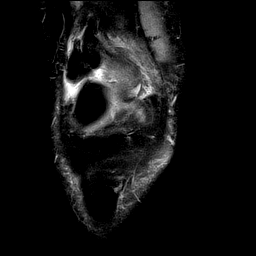
[im 29/29]
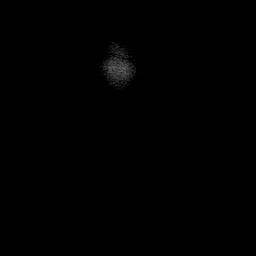

[Series 7: PD fat-sat · sagittal · 3.0mm · 0.62mm/px · 6 of 33 slices shown (2 of 3)]
[im 1/33]
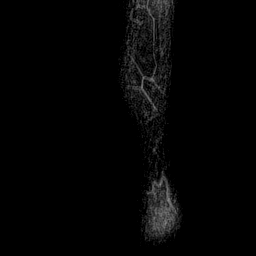
[im 7/33]
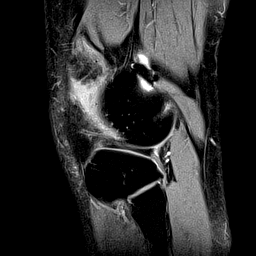
[im 13/33]
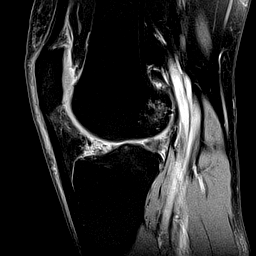
[im 20/33]
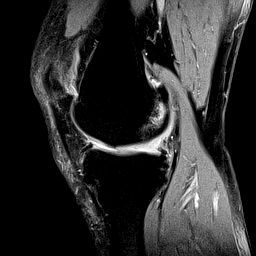
[im 26/33]
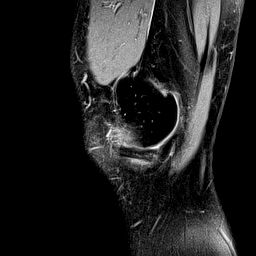
[im 33/33]
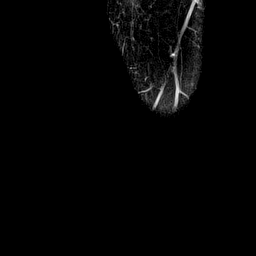

[Series 8: T2 fat-sat · sagittal · 3.0mm · 0.62mm/px · 6 of 33 slices shown (3 of 3)]
[im 1/33]
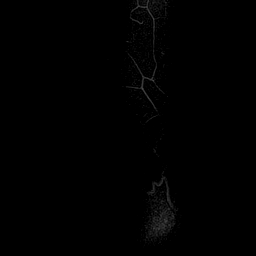
[im 7/33]
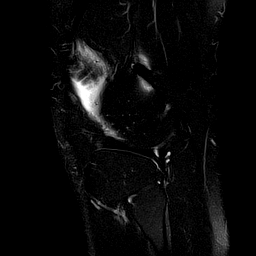
[im 13/33]
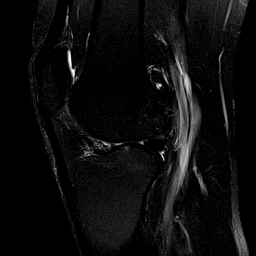
[im 20/33]
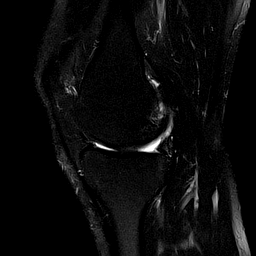
[im 26/33]
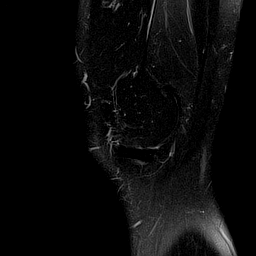
[im 33/33]
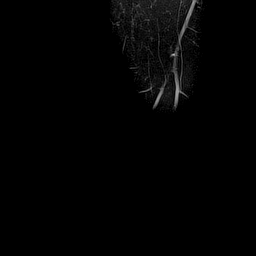

[Series 9: PD fat-sat · coronal · 2.0mm · 0.62mm/px · 4 of 19 slices shown (3 of 3)]
[im 1/19]
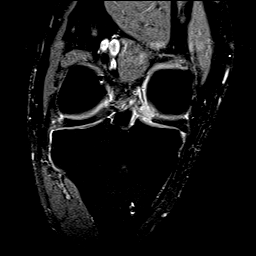
[im 7/19]
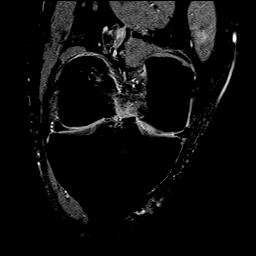
[im 13/19]
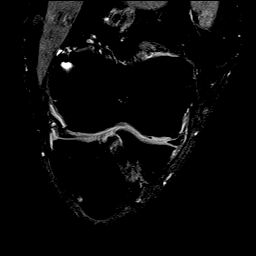
[im 19/19]
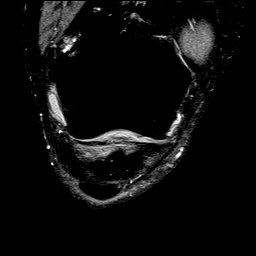

[40 of 40 positions shown; findings below may reference images not displayed]

FINDINGS: MENISCI

Medial meniscus: There is a near complete radial tear through the
root of the posterior horn with displacement of 1.2 cm. There may be
a thin strand of meniscus attaching to the tibia.

Lateral meniscus: Small radial tear is seen along the free edge at
the junction of the anterior horn and body. The root of the anterior
horn is markedly degenerated and attenuated.

LIGAMENTS

Cruciates: Status post ACL grafting. The graft is completely torn.
The PCL is intact.

Collaterals:  Intact.

CARTILAGE

Patellofemoral: There is some fraying and irregularity of the
surface of hyaline cartilage in the mid and lower patella and
central femoral trochlea.

Medial:  Mildly degenerated.

Lateral:  Minimally degenerated.

Joint:  Small effusion.

Popliteal Fossa:  No Baker's cyst.

Extensor Mechanism:  Intact.

Bones:  No fracture worrisome lesion

Other: None.
IMPRESSION: The patient's ACL graft is completely torn.

Near complete radial tear root of the posterior horn of the medial
meniscus.

Small radial tear free edge of the lateral meniscus at the junction
of the anterior horn and body. The root of the anterior horn of the
lateral meniscus is markedly degenerated.

Mild osteoarthritis.

## 2021-03-21 ENCOUNTER — Encounter: Payer: Self-pay | Admitting: Physician Assistant

## 2021-03-21 ENCOUNTER — Ambulatory Visit (INDEPENDENT_AMBULATORY_CARE_PROVIDER_SITE_OTHER): Payer: Self-pay | Admitting: Physician Assistant

## 2021-03-21 ENCOUNTER — Other Ambulatory Visit: Payer: Self-pay

## 2021-03-21 VITALS — BP 138/87 | HR 86 | Ht 67.0 in | Wt 189.0 lb

## 2021-03-21 DIAGNOSIS — N451 Epididymitis: Secondary | ICD-10-CM

## 2021-03-21 DIAGNOSIS — N50811 Right testicular pain: Secondary | ICD-10-CM

## 2021-03-21 MED ORDER — DOXYCYCLINE HYCLATE 100 MG PO TABS: 100.0000 mg | ORAL_TABLET | Freq: Two times a day (BID) | ORAL | 0 refills | Status: DC
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
## 2021-06-09 ENCOUNTER — Other Ambulatory Visit: Payer: Self-pay | Admitting: *Deleted

## 2021-06-09 DIAGNOSIS — Z0000 Encounter for general adult medical examination without abnormal findings: Secondary | ICD-10-CM

## 2021-06-09 DIAGNOSIS — Z114 Encounter for screening for human immunodeficiency virus [HIV]: Secondary | ICD-10-CM

## 2021-06-13 ENCOUNTER — Encounter: Payer: Medicaid Other | Admitting: Family Medicine

## 2021-06-13 ENCOUNTER — Encounter: Payer: Self-pay | Admitting: Family Medicine

## 2021-06-13 ENCOUNTER — Ambulatory Visit (INDEPENDENT_AMBULATORY_CARE_PROVIDER_SITE_OTHER): Payer: BC Managed Care – PPO | Admitting: Family Medicine

## 2021-06-13 VITALS — BP 120/73 | HR 62 | Ht 67.0 in | Wt 191.0 lb

## 2021-06-13 DIAGNOSIS — Z0000 Encounter for general adult medical examination without abnormal findings: Secondary | ICD-10-CM | POA: Diagnosis not present

## 2021-06-13 DIAGNOSIS — E7800 Pure hypercholesterolemia, unspecified: Secondary | ICD-10-CM | POA: Diagnosis not present

## 2021-06-13 DIAGNOSIS — Z23 Encounter for immunization: Secondary | ICD-10-CM

## 2021-06-13 DIAGNOSIS — N451 Epididymitis: Secondary | ICD-10-CM

## 2021-06-13 DIAGNOSIS — F439 Reaction to severe stress, unspecified: Secondary | ICD-10-CM

## 2021-06-13 DIAGNOSIS — Z114 Encounter for screening for human immunodeficiency virus [HIV]: Secondary | ICD-10-CM | POA: Diagnosis not present

## 2021-06-13 DIAGNOSIS — Z1211 Encounter for screening for malignant neoplasm of colon: Secondary | ICD-10-CM

## 2021-06-13 NOTE — Progress Notes (Signed)
? ?Complete physical exam ? ?Patient: Bradley Bernard   DOB: [birth year]   51 y.o. Male  MRN: [id number] ? ?Subjective:  ?  ?Chief Complaint  ?Patient presents with  ?? Annual Exam  ? ? ?Bradley Bernard is a 51 y.o. male who presents today for a complete physical exam. He reports consuming a general diet. The patient does not participate in regular exercise at present. He generally feels well. He reports sleeping most nights sleeps okay but occasionally will have insomnia especially if his stress is really getting to him.Marland Kitchen He does have additional problems to discuss today.  He has had a lot going on.  He is actually getting ready to go back down to [place] to help take care of his parents and they are supposed to come back up with him for the summer.  They have been declining.  He also has some stressors at work and particularly at home.  He says that he really for the most part is able to push through and keep going.  He feels like he is coping well.  Declines need for any assistance at this point in time. ? ?He had another recent episode of epididymitis.  He was previously being followed by urology but she retired.  They had discussed possible surgery at their last visit.  He would like to pick back up with a left off and get established with a new urologist. ? ?Most recent fall risk assessment: ? ?  06/13/2021  ?  8:56 AM  ?Fall Risk   ?Falls in the past year? 0  ?Number falls in past yr: 0  ?Injury with Fall? 0  ?Risk for fall due to : No Fall Risks  ?Follow up Falls prevention discussed  ? ?  ?Most recent depression screenings: ? ?  06/13/2021  ?  8:56 AM 08/03/2016  ?  1:14 PM  ?PHQ 2/9 Scores  ?PHQ - 2 Score 0 0  ? ? ? ? ?Past Surgical History:  ?Procedure Laterality Date  ?? Right knee arthroscopic assisted anterior cruciate ligament tear with Achilles allograft. Right 06/20/2012  ? right knee meniscu tear  ?? Right knee medial and lateral meniscectomies Right 06/20/2012  ? ?Social History  ? ?Tobacco Use  ??  Smoking status: Former  ?  Packs/day: 1.00  ?  Years: 13.00  ?  Pack years: 13.00  ?  Types: Cigarettes  ?? Smokeless tobacco: Never  ?Vaping Use  ?? Vaping Use: Never used  ?Substance Use Topics  ?? Alcohol use: No  ?? Drug use: No  ? ?Family History  ?Problem Relation Age of Onset  ?? Breast cancer Mother   ?? Hypertension Father   ? ?  ? ?Patient Care Team: ?Hali Marry, MD as PCP - General  ? ?Outpatient Medications Prior to Visit  ?Medication Sig  ?? [DISCONTINUED] doxycycline (VIBRA-TABS) 100 MG tablet Take 1 tablet (100 mg total) by mouth 2 (two) times daily.  ? ?No facility-administered medications prior to visit.  ? ? ?ROS ? ? ? ? ?   ?Objective:  ? ?  ?BP 120/73   Pulse 62   Ht '5\' 7"'$  (1.702 m)   Wt 191 lb (86.6 kg)   SpO2 96%   BMI 29.91 kg/m?  ? ? ?Physical Exam ?Constitutional:   ?   Appearance: He is well-developed.  ?HENT:  ?   Head: Normocephalic and atraumatic.  ?   Right Ear: External ear normal.  ?   Left Ear: External ear  normal.  ?   Nose: Nose normal.  ?Eyes:  ?   Conjunctiva/sclera: Conjunctivae normal.  ?   Pupils: Pupils are equal, round, and reactive to light.  ?Neck:  ?   Thyroid: No thyromegaly.  ?Cardiovascular:  ?   Rate and Rhythm: Normal rate and regular rhythm.  ?   Heart sounds: Normal heart sounds.  ?Pulmonary:  ?   Effort: Pulmonary effort is normal.  ?   Breath sounds: Normal breath sounds.  ?Abdominal:  ?   General: Bowel sounds are normal. There is no distension.  ?   Palpations: Abdomen is soft. There is no mass.  ?   Tenderness: There is no abdominal tenderness. There is no guarding or rebound.  ?Musculoskeletal:     ?   General: Normal range of motion.  ?   Cervical back: Normal range of motion and neck supple.  ?Lymphadenopathy:  ?   Cervical: No cervical adenopathy.  ?Skin: ?   General: Skin is warm and dry.  ?Neurological:  ?   Mental Status: He is alert and oriented to person, place, and time.  ?   Deep Tendon Reflexes: Reflexes are normal and symmetric.   ?Psychiatric:     ?   Behavior: Behavior normal.     ?   Thought Content: Thought content normal.     ?   Judgment: Judgment normal.  ?  ? ?No results found for any visits on 06/13/21. ? ?   ?Assessment & Plan:  ?  ?Routine Health Maintenance and Physical Exam ?Keep up a regular exercise program and make sure you are eating a healthy diet ?Try to eat 4 servings of dairy a day, or if you are lactose intolerant take a calcium with vitamin D daily.  ?Your vaccines are up to date.  ? ?Stress - offered to refer to counseling or therapy. He says will let me know when he feels he needs something different.   ? ? ?Immunization History  ?Administered Date(s) Administered  ?? Influenza Split 10/23/2012  ?? Influenza Whole 11/16/2008  ?? Influenza,inj,Quad PF,6+ Mos 10/29/2013, 02/04/2015, 11/06/2015  ?? PFIZER(Purple Top)SARS-COV-2 Vaccination 05/21/2019, 06/11/2019  ?? Td 02/05/2001  ?? Tdap 08/03/2016  ?? Zoster Recombinat (Shingrix) 06/13/2021  ? ? ?Health Maintenance  ?Topic Date Due  ?? COLONOSCOPY (Pts 45-76yr Insurance coverage will need to be confirmed)  Never done  ?? COVID-19 Vaccine (3 - Booster for [place]) 06/30/2023 (Originally 08/06/2019)  ?? Zoster Vaccines- Shingrix (2 of 2) 08/08/2021  ?? INFLUENZA VACCINE  09/05/2021  ?? TETANUS/TDAP  08/04/2026  ?? Hepatitis C Screening  Completed  ?? HIV Screening  Completed  ?? Pneumococcal Vaccine [id number] old  Aged Out  ?? HPV VACCINES  Aged Out  ? ? ?Discussed health benefits of physical activity, and encouraged him to engage in regular exercise appropriate for his age and condition. ? ?Problem List Items Addressed This Visit   ?None ?Visit Diagnoses   ? ? Screening for malignant neoplasm of colon    -  Primary  ? Relevant Orders  ? Ambulatory referral to Gastroenterology  ? Need for Zostavax administration      ? Relevant Orders  ? Varicella-zoster vaccine IM (Shingrix) (Completed)  ? Epididymitis      ? Relevant Orders  ? Ambulatory referral to Urology  ?  Stress at home      ? ?  ? ? ?Colonoscopy ref placed.     ?Urology referral placed as well. ? ?Return in  about 1 year (around 06/14/2022) for Wellness Exam. ? ?  ? ?Beatrice Lecher, MD ? ? ?

## 2021-06-13 NOTE — Progress Notes (Signed)
Pt had labs done this morning.

## 2021-06-14 ENCOUNTER — Other Ambulatory Visit: Payer: Self-pay | Admitting: *Deleted

## 2021-06-14 ENCOUNTER — Encounter: Payer: Self-pay | Admitting: *Deleted

## 2021-06-14 LAB — CBC
HCT: 47.3 % (ref 38.5–50.0)
Hemoglobin: 15.7 g/dL (ref 13.2–17.1)
MCH: 29 pg (ref 27.0–33.0)
MCHC: 33.2 g/dL (ref 32.0–36.0)
MCV: 87.4 fL (ref 80.0–100.0)
MPV: 10 fL (ref 7.5–12.5)
Platelets: 242 10*3/uL (ref 140–400)
RBC: 5.41 10*6/uL (ref 4.20–5.80)
RDW: 12.6 % (ref 11.0–15.0)
WBC: 5.8 10*3/uL (ref 3.8–10.8)

## 2021-06-14 LAB — COMPLETE METABOLIC PANEL WITH GFR
AG Ratio: 1.5 (calc) (ref 1.0–2.5)
ALT: 21 U/L (ref 9–46)
AST: 16 U/L (ref 10–35)
Albumin: 4.1 g/dL (ref 3.6–5.1)
Alkaline phosphatase (APISO): 51 U/L (ref 35–144)
BUN: 11 mg/dL (ref 7–25)
CO2: 28 mmol/L (ref 20–32)
Calcium: 9.8 mg/dL (ref 8.6–10.3)
Chloride: 105 mmol/L (ref 98–110)
Creat: 1.17 mg/dL (ref 0.70–1.30)
Globulin: 2.8 g/dL (calc) (ref 1.9–3.7)
Glucose, Bld: 93 mg/dL (ref 65–99)
Potassium: 4 mmol/L (ref 3.5–5.3)
Sodium: 139 mmol/L (ref 135–146)
Total Bilirubin: 0.7 mg/dL (ref 0.2–1.2)
Total Protein: 6.9 g/dL (ref 6.1–8.1)
eGFR: 75 mL/min/{1.73_m2} (ref >=60)

## 2021-06-14 LAB — LIPID PANEL W/REFLEX DIRECT LDL
Cholesterol: 169 mg/dL (ref <200)
HDL: 34 mg/dL — ABNORMAL LOW (ref >=40)
LDL Cholesterol (Calc): 109 mg/dL (calc) — ABNORMAL HIGH
Non-HDL Cholesterol (Calc): 135 mg/dL (calc) — ABNORMAL HIGH (ref <130)
Total CHOL/HDL Ratio: 5 (calc) — ABNORMAL HIGH (ref <5.0)
Triglycerides: 145 mg/dL (ref <150)

## 2021-06-14 LAB — HIV ANTIBODY (ROUTINE TESTING W REFLEX): HIV 1&2 Ab, 4th Generation: NONREACTIVE

## 2021-06-14 LAB — PSA: PSA: 0.73 ng/mL (ref <=4.00)

## 2021-06-14 LAB — TESTOSTERONE: Testosterone: 755 ng/dL (ref 250–827)

## 2021-06-14 NOTE — Progress Notes (Signed)
Negative for HIV

## 2021-06-14 NOTE — Progress Notes (Signed)
Hi Shmiel, LDL cholesterol is a little elevated at 109 similar to about 3 years ago.  Continue to work on healthy diet and regular exercise.  Testosterone level and prostate test are normal.  Complete blood count is normal.  Metabolic panel including liver and kidney function is normal.

## 2021-08-26 DIAGNOSIS — S61216A Laceration without foreign body of right little finger without damage to nail, initial encounter: Secondary | ICD-10-CM | POA: Diagnosis not present

## 2021-08-26 DIAGNOSIS — S61217A Laceration without foreign body of left little finger without damage to nail, initial encounter: Secondary | ICD-10-CM | POA: Diagnosis not present

## 2021-08-26 DIAGNOSIS — W260XXA Contact with knife, initial encounter: Secondary | ICD-10-CM | POA: Diagnosis not present

## 2021-08-26 DIAGNOSIS — Z23 Encounter for immunization: Secondary | ICD-10-CM | POA: Diagnosis not present

## 2021-08-29 ENCOUNTER — Telehealth: Payer: Self-pay | Admitting: General Practice

## 2021-08-29 NOTE — Telephone Encounter (Signed)
Transition Care Management Unsuccessful Follow-up Telephone Call  Date of discharge and from where:  08/26/21 from [facility]  Attempts:  1st Attempt  Reason for unsuccessful TCM follow-up call:  Left voice message

## 2021-08-30 NOTE — Telephone Encounter (Signed)
Transition Care Management Unsuccessful Follow-up Telephone Call  Date of discharge and from where:  08/26/21 from [facility]  Attempts:  2nd Attempt  Reason for unsuccessful TCM follow-up call:  Left voice message

## 2021-09-05 NOTE — Telephone Encounter (Signed)
Transition Care Management Unsuccessful Follow-up Telephone Call  Date of discharge and from where:  08/26/21 from [facility]  Attempts:  3rd Attempt  Reason for unsuccessful TCM follow-up call:  Left voice message

## 2021-10-18 DIAGNOSIS — K635 Polyp of colon: Secondary | ICD-10-CM | POA: Diagnosis not present

## 2021-10-18 DIAGNOSIS — Z1211 Encounter for screening for malignant neoplasm of colon: Secondary | ICD-10-CM | POA: Diagnosis not present

## 2021-10-18 DIAGNOSIS — D125 Benign neoplasm of sigmoid colon: Secondary | ICD-10-CM | POA: Diagnosis not present

## 2021-10-18 DIAGNOSIS — K648 Other hemorrhoids: Secondary | ICD-10-CM | POA: Diagnosis not present

## 2021-10-18 LAB — HM COLONOSCOPY

## 2021-11-01 ENCOUNTER — Ambulatory Visit: Payer: BC Managed Care – PPO

## 2021-11-03 ENCOUNTER — Ambulatory Visit (INDEPENDENT_AMBULATORY_CARE_PROVIDER_SITE_OTHER): Payer: BC Managed Care – PPO | Admitting: Physician Assistant

## 2021-11-03 DIAGNOSIS — Z23 Encounter for immunization: Secondary | ICD-10-CM | POA: Diagnosis not present

## 2021-11-21 ENCOUNTER — Other Ambulatory Visit (HOSPITAL_BASED_OUTPATIENT_CLINIC_OR_DEPARTMENT_OTHER): Payer: Self-pay

## 2021-11-21 MED ORDER — COMIRNATY 30 MCG/0.3ML IM SUSY
PREFILLED_SYRINGE | INTRAMUSCULAR | 0 refills | Status: DC
Start: 2021-11-21 — End: 2022-04-10
  Filled 2021-11-21: qty 0.3, 1d supply, fill #0

## 2022-01-08 ENCOUNTER — Encounter: Payer: Self-pay | Admitting: Family Medicine

## 2022-02-15 ENCOUNTER — Encounter: Payer: Self-pay | Admitting: Urology

## 2022-02-15 ENCOUNTER — Ambulatory Visit: Payer: BC Managed Care – PPO | Admitting: Urology

## 2022-02-15 VITALS — BP 137/93 | HR 71 | Ht 67.0 in | Wt 189.0 lb

## 2022-02-15 DIAGNOSIS — N5082 Scrotal pain: Secondary | ICD-10-CM | POA: Diagnosis not present

## 2022-02-15 DIAGNOSIS — N451 Epididymitis: Secondary | ICD-10-CM

## 2022-02-15 LAB — URINALYSIS
Bilirubin, UA: NEGATIVE
Blood, UA: NEGATIVE
Glucose, UA: NEGATIVE mg/dL
Ketones, UA: NEGATIVE
Leukocytes, UA: NEGATIVE
Nitrite, UA: NEGATIVE
Protein, UA: NEGATIVE
Spec Grav, UA: 1.015 (ref 1.010–1.025)
Urobilinogen, UA: 0.2 E.U./dL
pH, UA: 7 (ref 5.0–8.0)

## 2022-02-15 MED ORDER — DOXYCYCLINE HYCLATE 100 MG PO CAPS: 100.0000 mg | ORAL_CAPSULE | Freq: Two times a day (BID) | ORAL | 0 refills | Status: AC

## 2022-02-15 NOTE — Progress Notes (Signed)
Assessment: 1. Scrotal pain   2. Epididymitis, recurrent    Plan: I personally reviewed the patient's chart including provider notes, lab results. Diagnosis and management of epididymitis discussed with the patient today. Begin doxycycline 100 mg twice daily x 14 days.  Prescription sent. Ibuprofen 600 mg 3 times daily x 5 days, may repeat x 1. Return to office in 1 month. Consider further evaluation with a scrotal ultrasound if symptoms persist following treatment   Chief Complaint:  Chief Complaint  Patient presents with   epididymitis    History of Present Illness:  Bradley Bernard is a 52 y.o. male who is seen in consultation from Hali Marry, MD for evaluation of recurrent epididymitis. He underwent a vasectomy in 2003 and since that time has had recurrent current episodes of epididymitis.  These episodes occur approximately 1 time per year.  He typically has symptoms on the left side.  He reports pain in the scrotum.  His symptoms typically resolve with antibiotic therapy.  No scrotal swelling or redness associated with these episodes.  He has also noted a change in the appearance of his seminal fluid with a yellowish color.  No hematospermia. He has previously been evaluated by Dr. Kathyrn Lass.  He was last seen by her in April 2018.  It does not appear that he has had any recent urologic follow-up. His last episode of epididymitis was approximately 2 years ago. No recent imaging studies have been performed. PSA 07-18-22: 0.73   He currently reports discomfort in the right scrotum for 4-5 days.  No scrotal swelling or redness.  No lower urinary tract symptoms.  No dysuria or gross hematuria. IPSS = 0 today.  Past Medical History:  Past Medical History:  Diagnosis Date   Asthma    Seasonal allergies     Past Surgical History:  Past Surgical History:  Procedure Laterality Date   Right knee arthroscopic assisted anterior cruciate ligament tear with Achilles  allograft. Right 06/20/2012   right knee meniscu tear   Right knee medial and lateral meniscectomies Right 06/20/2012    Allergies:  No Known Allergies  Family History:  Family History  Problem Relation Age of Onset   Breast cancer Mother    Hypertension Father     Social History:  Social History   Tobacco Use   Smoking status: Former    Packs/day: 1.00    Years: 13.00    Total pack years: 13.00    Types: Cigarettes   Smokeless tobacco: Never  Vaping Use   Vaping Use: Never used  Substance Use Topics   Alcohol use: No   Drug use: No    Review of symptoms:  Constitutional:  Negative for unexplained weight loss, night sweats, fever, chills ENT:  Negative for nose bleeds, sinus pain, painful swallowing CV:  Negative for chest pain, shortness of breath, exercise intolerance, palpitations, loss of consciousness Resp:  Negative for cough, wheezing, shortness of breath GI:  Negative for nausea, vomiting, diarrhea, bloody stools GU:  Positives noted in HPI; otherwise negative for gross hematuria, dysuria, urinary incontinence Neuro:  Negative for seizures, poor balance, limb weakness, slurred speech Psych:  Negative for lack of energy, depression, anxiety Endocrine:  Negative for polydipsia, polyuria, symptoms of hypoglycemia (dizziness, hunger, sweating) Hematologic:  Negative for anemia, purpura, petechia, prolonged or excessive bleeding, use of anticoagulants  Allergic:  Negative for difficulty breathing or choking as a result of exposure to anything; no shellfish allergy; no allergic response (rash/itch) to materials, foods  Physical exam: BP (!) 137/93   Pulse 71   Ht '5\' 7"'$  (1.702 m)   Wt 189 lb (85.7 kg)   BMI 29.60 kg/m  GENERAL APPEARANCE:  Well appearing, well developed, well nourished, NAD HEENT: Atraumatic, Normocephalic, oropharynx clear. NECK: Supple without lymphadenopathy or thyromegaly. LUNGS: Clear to auscultation bilaterally. HEART: Regular Rate and  Rhythm without murmurs, gallops, or rubs. ABDOMEN: Soft, non-tender, No Masses. EXTREMITIES: Moves all extremities well.  Without clubbing, cyanosis, or edema. NEUROLOGIC:  Alert and oriented x 3, normal gait, CN II-XII grossly intact.  MENTAL STATUS:  Appropriate. BACK:  Non-tender to palpation.  No CVAT SKIN:  Warm, dry and intact.   GU: Penis:  uncircumcised Meatus: Normal Scrotum: No erythema or edema Testis: normal without masses bilateral Epididymis: Minimally tender on right; soft mass palpated at left epididymal head consistent with epididymal cyst Prostate: 30 gm, NT, no nodules Rectum: Normal tone,  no masses or tenderness   Results: U/A dipstick: negative

## 2022-02-21 ENCOUNTER — Other Ambulatory Visit: Payer: Self-pay | Admitting: Urology

## 2022-02-21 ENCOUNTER — Ambulatory Visit: Payer: BC Managed Care – PPO | Admitting: Urology

## 2022-02-21 ENCOUNTER — Encounter: Payer: Self-pay | Admitting: Urology

## 2022-02-21 MED ORDER — NYSTATIN 100000 UNIT/GM EX CREA: TOPICAL_CREAM | CUTANEOUS | 0 refills | Status: DC

## 2022-03-22 ENCOUNTER — Ambulatory Visit: Payer: BC Managed Care – PPO | Admitting: Urology

## 2022-03-22 NOTE — Progress Notes (Deleted)
   Assessment: 1. Epididymitis, recurrent     Plan: Diagnosis and management of epididymitis discussed with the patient today. Begin doxycycline 100 mg twice daily x 14 days.  Prescription sent. Ibuprofen 600 mg 3 times daily x 5 days, may repeat x 1. Return to office in 1 month. Consider further evaluation with a scrotal ultrasound if symptoms persist following treatment   Chief Complaint:  No chief complaint on file.   History of Present Illness:  Bradley Bernard is a 52 y.o. male who is seen for further evaluation of recurrent epididymitis. He underwent a vasectomy in 2003 and since that time has had recurrent current episodes of epididymitis.  These episodes occur approximately 1 time per year.  He typically has symptoms on the left side.  He reports pain in the scrotum.  His symptoms typically resolve with antibiotic therapy.  No scrotal swelling or redness associated with these episodes.  He has also noted a change in the appearance of his seminal fluid with a yellowish color.  No hematospermia. He has previously been evaluated by Dr. Kathyrn Lass.  He was last seen by her in April 2018.  It does not appear that he has had any recent urologic follow-up. His last episode of epididymitis was approximately 2 years ago. No recent imaging studies have been performed. PSA 07/03/22: 0.73   At his visit in 1/24, he reported discomfort in the right scrotum for 4-5 days.  No scrotal swelling or redness.  No lower urinary tract symptoms.  No dysuria or gross hematuria. IPSS = 0. He was treated with doxycycline x 2 weeks and ibuprofen.  Portions of the above documentation were copied from a prior visit for review purposes only.   Past Medical History:  Past Medical History:  Diagnosis Date   Asthma    Seasonal allergies     Past Surgical History:  Past Surgical History:  Procedure Laterality Date   Right knee arthroscopic assisted anterior cruciate ligament tear with Achilles  allograft. Right 06/20/2012   right knee meniscu tear   Right knee medial and lateral meniscectomies Right 06/20/2012    Allergies:  No Known Allergies  Family History:  Family History  Problem Relation Age of Onset   Breast cancer Mother    Hypertension Father     Social History:  Social History   Tobacco Use   Smoking status: Former    Packs/day: 1.00    Years: 13.00    Total pack years: 13.00    Types: Cigarettes   Smokeless tobacco: Never  Vaping Use   Vaping Use: Never used  Substance Use Topics   Alcohol use: No   Drug use: No    ROS: Constitutional:  Negative for fever, chills, weight loss CV: Negative for chest pain, previous MI, hypertension Respiratory:  Negative for shortness of breath, wheezing, sleep apnea, frequent cough GI:  Negative for nausea, vomiting, bloody stool, GERD  Physical exam: There were no vitals taken for this visit. GENERAL APPEARANCE:  Well appearing, well developed, well nourished, NAD HEENT:  Atraumatic, normocephalic, oropharynx clear NECK:  Supple without lymphadenopathy or thyromegaly ABDOMEN:  Soft, non-tender, no masses EXTREMITIES:  Moves all extremities well, without clubbing, cyanosis, or edema NEUROLOGIC:  Alert and oriented x 3, normal gait, CN II-XII grossly intact MENTAL STATUS:  appropriate BACK:  Non-tender to palpation, No CVAT SKIN:  Warm, dry, and intact   Results: U/A dipstick:

## 2022-04-10 ENCOUNTER — Encounter: Payer: Self-pay | Admitting: Physician Assistant

## 2022-04-10 ENCOUNTER — Ambulatory Visit: Payer: BC Managed Care – PPO | Admitting: Physician Assistant

## 2022-04-10 VITALS — BP 143/82 | HR 78 | Ht 67.0 in | Wt 189.0 lb

## 2022-04-10 DIAGNOSIS — M5442 Lumbago with sciatica, left side: Secondary | ICD-10-CM | POA: Diagnosis not present

## 2022-04-10 DIAGNOSIS — G8929 Other chronic pain: Secondary | ICD-10-CM

## 2022-04-10 DIAGNOSIS — M5136 Other intervertebral disc degeneration, lumbar region: Secondary | ICD-10-CM

## 2022-04-10 MED ORDER — CYCLOBENZAPRINE HCL 10 MG PO TABS: 10.0000 mg | ORAL_TABLET | Freq: Three times a day (TID) | ORAL | 0 refills | Status: DC | PRN

## 2022-04-10 MED ORDER — PREDNISONE 50 MG PO TABS: ORAL_TABLET | ORAL | 0 refills | Status: DC

## 2022-04-10 MED ORDER — KETOROLAC TROMETHAMINE 60 MG/2ML IM SOLN
60.0000 mg | Freq: Once | INTRAMUSCULAR | Status: AC
  Administered 2022-04-10: 60 mg via INTRAMUSCULAR
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
## 2022-04-10 NOTE — Patient Instructions (Addendum)
Consider dry needling  Low Back Sprain or Strain Rehab Ask your health care provider which exercises are safe for you. Do exercises exactly as told by your health care provider and adjust them as directed. It is normal to feel mild stretching, pulling, tightness, or discomfort as you do these exercises. Stop right away if you feel sudden pain or your pain gets worse. Do not begin these exercises until told by your health care provider. Stretching and range-of-motion exercises These exercises warm up your muscles and joints and improve the movement and flexibility of your back. These exercises also help to relieve pain, numbness, and tingling. Lumbar rotation  Lie on your back on a firm bed or the floor with your knees bent. Straighten your arms out to your sides so each arm forms a 90-degree angle (right angle) with a side of your body. Slowly move (rotate) both of your knees to one side of your body until you feel a stretch in your lower back (lumbar). Try not to let your shoulders lift off the floor. Hold this position for __________ seconds. Tense your abdominal muscles and slowly move your knees back to the starting position. Repeat this exercise on the other side of your body. Repeat __________ times. Complete this exercise __________ times a day. Single knee to chest  Lie on your back on a firm bed or the floor with both legs straight. Bend one of your knees. Use your hands to move your knee up toward your chest until you feel a gentle stretch in your lower back and buttock. Hold your leg in this position by holding on to the front of your knee. Keep your other leg as straight as possible. Hold this position for __________ seconds. Slowly return to the starting position. Repeat with your other leg. Repeat __________ times. Complete this exercise __________ times a day. Prone extension on elbows  Lie on your abdomen on a firm bed or the floor (prone position). Prop yourself up on  your elbows. Use your arms to help lift your chest up until you feel a gentle stretch in your abdomen and your lower back. This will place some of your body weight on your elbows. If this is uncomfortable, try stacking pillows under your chest. Your hips should stay down, against the surface that you are lying on. Keep your hip and back muscles relaxed. Hold this position for __________ seconds. Slowly relax your upper body and return to the starting position. Repeat __________ times. Complete this exercise __________ times a day. Strengthening exercises These exercises build strength and endurance in your back. Endurance is the ability to use your muscles for a long time, even after they get tired. Pelvic tilt This exercise strengthens the muscles that lie deep in the abdomen. Lie on your back on a firm bed or the floor with your legs extended. Bend your knees so they are pointing toward the ceiling and your feet are flat on the floor. Tighten your lower abdominal muscles to press your lower back against the floor. This motion will tilt your pelvis so your tailbone points up toward the ceiling instead of pointing to your feet or the floor. To help with this exercise, you may place a small towel under your lower back and try to push your back into the towel. Hold this position for __________ seconds. Let your muscles relax completely before you repeat this exercise. Repeat __________ times. Complete this exercise __________ times a day. Alternating arm and leg raises  Get on your hands and knees on a firm surface. If you are on a hard floor, you may want to use padding, such as an exercise mat, to cushion your knees. Line up your arms and legs. Your hands should be directly below your shoulders, and your knees should be directly below your hips. Lift your left leg behind you. At the same time, raise your right arm and straighten it in front of you. Do not lift your leg higher than your  hip. Do not lift your arm higher than your shoulder. Keep your abdominal and back muscles tight. Keep your hips facing the ground. Do not arch your back. Keep your balance carefully, and do not hold your breath. Hold this position for __________ seconds. Slowly return to the starting position. Repeat with your right leg and your left arm. Repeat __________ times. Complete this exercise __________ times a day. Abdominal set with straight leg raise  Lie on your back on a firm bed or the floor. Bend one of your knees and keep your other leg straight. Tense your abdominal muscles and lift your straight leg up, 4-6 inches (10-15 cm) off the ground. Keep your abdominal muscles tight and hold this position for __________ seconds. Do not hold your breath. Do not arch your back. Keep it flat against the ground. Keep your abdominal muscles tense as you slowly lower your leg back to the starting position. Repeat with your other leg. Repeat __________ times. Complete this exercise __________ times a day. Single leg lower with bent knees Lie on your back on a firm bed or the floor. Tense your abdominal muscles and lift your feet off the floor, one foot at a time, so your knees and hips are bent in 90-degree angles (right angles). Your knees should be over your hips and your lower legs should be parallel to the floor. Keeping your abdominal muscles tense and your knee bent, slowly lower one of your legs so your toe touches the ground. Lift your leg back up to return to the starting position. Do not hold your breath. Do not let your back arch. Keep your back flat against the ground. Repeat with your other leg. Repeat __________ times. Complete this exercise __________ times a day. Posture and body mechanics Good posture and healthy body mechanics can help to relieve stress in your body's tissues and joints. Body mechanics refers to the movements and positions of your body while you do your daily  activities. Posture is part of body mechanics. Good posture means: Your spine is in its natural S-curve position (neutral). Your shoulders are pulled back slightly. Your head is not tipped forward (neutral). Follow these guidelines to improve your posture and body mechanics in your everyday activities. Standing  When standing, keep your spine neutral and your feet about hip-width apart. Keep a slight bend in your knees. Your ears, shoulders, and hips should line up. When you do a task in which you stand in one place for a long time, place one foot up on a stable object that is 2-4 inches (5-10 cm) high, such as a footstool. This helps keep your spine neutral. Sitting  When sitting, keep your spine neutral and keep your feet flat on the floor. Use a footrest, if necessary, and keep your thighs parallel to the floor. Avoid rounding your shoulders, and avoid tilting your head forward. When working at a desk or a computer, keep your desk at a height where your hands are slightly lower than  your elbows. Slide your chair under your desk so you are close enough to maintain good posture. When working at a computer, place your monitor at a height where you are looking straight ahead and you do not have to tilt your head forward or downward to look at the screen. Resting When lying down and resting, avoid positions that are most painful for you. If you have pain with activities such as sitting, bending, stooping, or squatting, lie in a position in which your body does not bend very much. For example, avoid curling up on your side with your arms and knees near your chest (fetal position). If you have pain with activities such as standing for a long time or reaching with your arms, lie with your spine in a neutral position and bend your knees slightly. Try the following positions: Lying on your side with a pillow between your knees. Lying on your back with a pillow under your knees. Lifting  When lifting  objects, keep your feet at least shoulder-width apart and tighten your abdominal muscles. Bend your knees and hips and keep your spine neutral. It is important to lift using the strength of your legs, not your back. Do not lock your knees straight out. Always ask for help to lift heavy or awkward objects. This information is not intended to replace advice given to you by your health care provider. Make sure you discuss any questions you have with your health care provider. Document Revised: 04/11/2020 Document Reviewed: 04/11/2020 Elsevier Patient Education  [employer].

## 2022-04-24 ENCOUNTER — Ambulatory Visit: Payer: BC Managed Care – PPO | Admitting: Urology

## 2022-05-01 ENCOUNTER — Ambulatory Visit: Payer: BC Managed Care – PPO | Admitting: Family Medicine

## 2022-05-01 VITALS — BP 119/66 | HR 74 | Ht 67.0 in | Wt 190.0 lb

## 2022-05-01 DIAGNOSIS — M5136 Other intervertebral disc degeneration, lumbar region: Secondary | ICD-10-CM

## 2022-05-01 DIAGNOSIS — M5442 Lumbago with sciatica, left side: Secondary | ICD-10-CM

## 2022-05-01 DIAGNOSIS — Z23 Encounter for immunization: Secondary | ICD-10-CM | POA: Diagnosis not present

## 2022-05-01 DIAGNOSIS — Z636 Dependent relative needing care at home: Secondary | ICD-10-CM

## 2022-05-01 DIAGNOSIS — G8929 Other chronic pain: Secondary | ICD-10-CM | POA: Diagnosis not present
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
## 2022-05-01 NOTE — Assessment & Plan Note (Addendum)
He doesn't need any additional support at this time.  Is really difficult especially dealing with dementia especially advanced dementia.. Getting his parents estab with PCP in June.

## 2022-05-08 ENCOUNTER — Ambulatory Visit: Payer: BC Managed Care – PPO | Admitting: Family Medicine

## 2022-05-09 ENCOUNTER — Ambulatory Visit: Payer: BC Managed Care – PPO | Admitting: Family Medicine

## 2022-06-18 ENCOUNTER — Ambulatory Visit (INDEPENDENT_AMBULATORY_CARE_PROVIDER_SITE_OTHER): Payer: BC Managed Care – PPO | Admitting: Family Medicine

## 2022-06-18 ENCOUNTER — Encounter: Payer: Self-pay | Admitting: Family Medicine

## 2022-06-18 VITALS — BP 109/66 | HR 61 | Ht 67.0 in | Wt 184.0 lb

## 2022-06-18 DIAGNOSIS — Z0000 Encounter for general adult medical examination without abnormal findings: Secondary | ICD-10-CM

## 2022-06-18 DIAGNOSIS — E785 Hyperlipidemia, unspecified: Secondary | ICD-10-CM | POA: Diagnosis not present

## 2022-06-18 DIAGNOSIS — Z125 Encounter for screening for malignant neoplasm of prostate: Secondary | ICD-10-CM

## 2022-06-18 LAB — CBC
Hemoglobin: 16.1 g/dL (ref 13.2–17.1)
MCH: 29.2 pg (ref 27.0–33.0)
MCHC: 33.1 g/dL (ref 32.0–36.0)
MCV: 88 fL (ref 80.0–100.0)
MPV: 9.9 fL (ref 7.5–12.5)
WBC: 6.2 10*3/uL (ref 3.8–10.8)

## 2022-06-18 NOTE — Progress Notes (Addendum)
Complete physical exam  Patient: Bradley Bernard   DOB: Nov 27, 1970   52 y.o. Male  MRN: [id number]  Subjective:    Chief Complaint  Patient presents with   Annual Exam    Bradley Bernard is a 52 y.o. male who presents today for a complete physical exam. He reports consuming a general diet. The patient does not participate in regular exercise at present. He generally feels well.  He does not have additional problems to discuss today.  He has a lot of home stress and helping take care of his diet his mom and his wife.  But he feels like he is actually coping well overall.  He has been trying to do some things to decompress like doing some video games in the evening.  Current Exercise Habits: The patient does not participate in regular exercise at present Exercise limited by: None identified   Most recent fall risk assessment:    06/18/2022    8:10 AM  Fall Risk   Falls in the past year? 0  Number falls in past yr: 0  Injury with Fall? 0  Follow up Falls evaluation completed     Most recent depression screenings:    05/01/2022    1:13 PM 06/13/2021    8:56 AM  PHQ 2/9 Scores  PHQ - 2 Score 0 0        Patient Care Team: Agapito Games, MD as PCP - General   Outpatient Medications Prior to Visit  Medication Sig   cyclobenzaprine (FLEXERIL) 10 MG tablet Take 1 tablet (10 mg total) by mouth 3 (three) times daily as needed for muscle spasms.   fluticasone (FLONASE) 50 MCG/ACT nasal spray Place into both nostrils daily.   [DISCONTINUED] acetaminophen (TYLENOL) 650 MG CR tablet Take 650 mg by mouth every 8 (eight) hours as needed for pain.   No facility-administered medications prior to visit.    ROS        Objective:     BP 109/66   Pulse 61   Ht 5\' 7"  (1.702 m)   Wt 184 lb (83.5 kg)   SpO2 98%   BMI 28.82 kg/m    Physical Exam Constitutional:      Appearance: He is well-developed.  HENT:     Head: Normocephalic and atraumatic.     Right Ear:  Tympanic membrane, ear canal and external ear normal.     Left Ear: Tympanic membrane, ear canal and external ear normal.     Nose: Nose normal.     Mouth/Throat:     Pharynx: Oropharynx is clear.  Eyes:     Conjunctiva/sclera: Conjunctivae normal.     Pupils: Pupils are equal, round, and reactive to light.  Neck:     Thyroid: No thyromegaly.  Cardiovascular:     Rate and Rhythm: Normal rate and regular rhythm.     Heart sounds: Normal heart sounds.  Pulmonary:     Effort: Pulmonary effort is normal.     Breath sounds: Normal breath sounds.  Abdominal:     General: Bowel sounds are normal. There is no distension.     Palpations: Abdomen is soft. There is no mass.     Tenderness: There is no abdominal tenderness. There is no guarding or rebound.     Comments: + umbilical hernia  Musculoskeletal:        General: Normal range of motion.     Cervical back: Normal range of motion and neck supple. No tenderness.  Lymphadenopathy:     Cervical: No cervical adenopathy.  Skin:    General: Skin is warm and dry.  Neurological:     Mental Status: He is alert and oriented to person, place, and time.     Deep Tendon Reflexes: Reflexes are normal and symmetric.  Psychiatric:        Behavior: Behavior normal.        Thought Content: Thought content normal.        Judgment: Judgment normal.      No results found for any visits on 06/18/22.     Assessment & Plan:    Routine Health Maintenance and Physical Exam  Immunization History  Administered Date(s) Administered   COVID-19, mRNA, vaccine(Comirnaty)12 years and older 11/21/2021   Influenza Split 10/23/2012   Influenza Whole 11/16/2008   Influenza,inj,Quad PF,6+ Mos 10/29/2013, 02/04/2015, 11/06/2015, 11/03/2021   PFIZER(Purple Top)SARS-COV-2 Vaccination 05/21/2019, 06/11/2019   Td 02/05/2001   Tdap 08/03/2016, 08/26/2021   Zoster Recombinat (Shingrix) 06/13/2021, 05/01/2022    Health Maintenance  Topic Date Due    Pneumococcal Vaccine 70-62 Years old (1 of 2 - PCV) 05/01/2023 (Originally 04/03/1975)   INFLUENZA VACCINE  09/06/2022   DTaP/Tdap/Td (4 - Td or Tdap) 08/27/2031   COLONOSCOPY (Pts 45-85yrs Insurance coverage will need to be confirmed)  10/19/2031   COVID-19 Vaccine  Completed   Hepatitis C Screening  Completed   HIV Screening  Completed   Zoster Vaccines- Shingrix  Completed   HPV VACCINES  Aged Out    Discussed health benefits of physical activity, and encouraged him to engage in regular exercise appropriate for his age and condition.  Problem List Items Addressed This Visit       Other   Hyperlipidemia   Relevant Orders   Lipid Panel w/reflex Direct LDL   COMPLETE METABOLIC PANEL WITH GFR   CBC   PSA   Other Visit Diagnoses     Wellness examination    -  Primary   Relevant Orders   Lipid Panel w/reflex Direct LDL   COMPLETE METABOLIC PANEL WITH GFR   CBC   PSA   Screening for prostate cancer       Relevant Orders   PSA      No follow-ups on file.  Keep up a regular exercise program and make sure you are eating a healthy diet Try to eat 4 servings of dairy a day, or if you are lactose intolerant take a calcium with vitamin D daily.  Your vaccines are up to date.  Will get some updated labs. Did encourage him to reach out if at any point he feels like he is starting to feel little overwhelmed with his stress.  Right now he feels like he is really doing well and able to manage.     Nani Gasser, MD

## 2022-06-19 LAB — COMPLETE METABOLIC PANEL WITH GFR
AG Ratio: 1.3 (calc) (ref 1.0–2.5)
ALT: 16 U/L (ref 9–46)
AST: 14 U/L (ref 10–35)
Albumin: 4.3 g/dL (ref 3.6–5.1)
Alkaline phosphatase (APISO): 46 U/L (ref 35–144)
BUN: 11 mg/dL (ref 7–25)
CO2: 28 mmol/L (ref 20–32)
Calcium: 9.7 mg/dL (ref 8.6–10.3)
Chloride: 104 mmol/L (ref 98–110)
Creat: 1.08 mg/dL (ref 0.70–1.30)
Globulin: 3.2 g/dL (calc) (ref 1.9–3.7)
Glucose, Bld: 93 mg/dL (ref 65–99)
Potassium: 4.5 mmol/L (ref 3.5–5.3)
Sodium: 140 mmol/L (ref 135–146)
Total Bilirubin: 0.5 mg/dL (ref 0.2–1.2)
Total Protein: 7.5 g/dL (ref 6.1–8.1)
eGFR: 82 mL/min/{1.73_m2} (ref >=60)

## 2022-06-19 LAB — LIPID PANEL W/REFLEX DIRECT LDL
Cholesterol: 179 mg/dL (ref <200)
HDL: 34 mg/dL — ABNORMAL LOW (ref >=40)
LDL Cholesterol (Calc): 121 mg/dL (calc) — ABNORMAL HIGH
Non-HDL Cholesterol (Calc): 145 mg/dL (calc) — ABNORMAL HIGH (ref <130)
Total CHOL/HDL Ratio: 5.3 (calc) — ABNORMAL HIGH (ref <5.0)
Triglycerides: 125 mg/dL (ref <150)

## 2022-06-19 LAB — CBC
HCT: 48.6 % (ref 38.5–50.0)
Platelets: 287 10*3/uL (ref 140–400)
RBC: 5.52 10*6/uL (ref 4.20–5.80)
RDW: 12.3 % (ref 11.0–15.0)

## 2022-06-19 LAB — PSA: PSA: 0.82 ng/mL (ref <=4.00)

## 2022-06-19 NOTE — Progress Notes (Signed)
Hi [name], your LDL is up a little form last couple of years.  Continue to work on healthy diet and regular exercise. All other labs look great!!

## 2023-04-28 ENCOUNTER — Ambulatory Visit

## 2023-04-29 ENCOUNTER — Encounter: Payer: Self-pay | Admitting: Family Medicine

## 2023-04-29 ENCOUNTER — Ambulatory Visit (INDEPENDENT_AMBULATORY_CARE_PROVIDER_SITE_OTHER): Admitting: Family Medicine

## 2023-04-29 VITALS — BP 130/79 | HR 76 | Temp 98.4°F | Ht 67.0 in | Wt 189.0 lb

## 2023-04-29 DIAGNOSIS — U071 COVID-19: Secondary | ICD-10-CM | POA: Diagnosis not present

## 2023-04-29 DIAGNOSIS — R051 Acute cough: Secondary | ICD-10-CM

## 2023-04-29 DIAGNOSIS — J4541 Moderate persistent asthma with (acute) exacerbation: Secondary | ICD-10-CM | POA: Diagnosis not present

## 2023-04-29 LAB — POCT INFLUENZA A/B
Influenza A, POC: NEGATIVE
Influenza B, POC: NEGATIVE

## 2023-04-29 LAB — POC COVID19 BINAXNOW: SARS Coronavirus 2 Ag: POSITIVE — AB

## 2023-04-29 MED ORDER — NIRMATRELVIR/RITONAVIR (PAXLOVID)TABLET
3.0000 | ORAL_TABLET | Freq: Two times a day (BID) | ORAL | 0 refills | Status: AC
Start: 2023-04-29 — End: 2023-05-04

## 2023-04-29 MED ORDER — PREDNISONE 20 MG PO TABS
40.0000 mg | ORAL_TABLET | Freq: Every day | ORAL | 0 refills | Status: DC
Start: 2023-04-29 — End: 2023-06-11

## 2023-04-29 NOTE — Progress Notes (Signed)
 Acute Office Visit  Subjective:     Patient ID: Bradley Topel., male    DOB: 06-01-1970, 53 y.o.   MRN: [id number]  Chief Complaint  Patient presents with   Cough    X4 days   Nasal Congestion    X 4 days facial pain,scratchy throat, funny taste    HPI Patient is in today for Cough and nasal congestion x 4 day.  Thing to use the nebulizer pretty frequently almost every 4 hours.  He feels like the cough is becoming a little bit more productive but mostly just seeing clear sputum right now.  Throat feels irritated.  He never ran a fever.  Started his Allegra, and nasal spray for allergies.  ROS      Objective:    BP 130/79   Pulse 76   Temp 98.4 F (36.9 C) (Oral)   Ht 5\' 7"  (1.702 m)   Wt 189 lb (85.7 kg)   SpO2 98%   BMI 29.60 kg/m     Physical Exam Constitutional:      Appearance: Normal appearance.  HENT:     Head: Normocephalic and atraumatic.     Right Ear: Tympanic membrane, ear canal and external ear normal. There is no impacted cerumen.     Left Ear: Tympanic membrane, ear canal and external ear normal. There is no impacted cerumen.     Nose: Nose normal.     Mouth/Throat:     Pharynx: Oropharynx is clear.  Eyes:     Conjunctiva/sclera: Conjunctivae normal.  Cardiovascular:     Rate and Rhythm: Normal rate and regular rhythm.  Pulmonary:     Effort: Pulmonary effort is normal.     Breath sounds: Normal breath sounds.  Musculoskeletal:     Cervical back: Neck supple. No tenderness.  Lymphadenopathy:     Cervical: No cervical adenopathy.  Skin:    General: Skin is warm and dry.  Neurological:     Mental Status: He is alert and oriented to person, place, and time.  Psychiatric:        Mood and Affect: Mood normal.     Results for orders placed or performed in visit on 04/29/23  POC COVID-19  Result Value Ref Range   SARS Coronavirus 2 Ag Positive (A) Negative  POCT Influenza A/B  Result Value Ref Range   Influenza A, POC Negative  Negative   Influenza B, POC Negative Negative        Assessment & Plan:   Problem List Items Addressed This Visit       Respiratory   Asthma with exacerbation   Relevant Medications   predniSONE (DELTASONE) 20 MG tablet   Other Visit Diagnoses       Acute cough    -  Primary   Relevant Orders   POC COVID-19 (Completed)   POCT Influenza A/B (Completed)     COVID-19       Relevant Medications   nirmatrelvir/ritonavir (PAXLOVID) 20 x 150 MG & 10 x 100MG  TABS       COVID-19-will treat with Paxlovid.  Also having an ex asthma exacerbation will add prednisone continue nebulizer treatments as needed if not improving then please let us know.  Lungs are clear on exam today.  Meds ordered this encounter  Medications   predniSONE (DELTASONE) 20 MG tablet    Sig: Take 2 tablets (40 mg total) by mouth daily with breakfast.    Dispense:  10 tablet    Refill:  0   nirmatrelvir/ritonavir (PAXLOVID) 20 x 150 MG & 10 x 100MG  TABS    Sig: Take 3 tablets by mouth 2 (two) times daily for 5 days. (Take nirmatrelvir 150 mg two tablets twice daily for 5 days and ritonavir 100 mg one tablet twice daily for 5 days) Patient GFR is 82    Dispense:  30 tablet    Refill:  0    Return if symptoms worsen or fail to improve.  Nani Gasser, MD

## 2023-05-08 ENCOUNTER — Other Ambulatory Visit: Payer: Self-pay

## 2023-06-04 ENCOUNTER — Telehealth: Admitting: Family Medicine

## 2023-06-04 ENCOUNTER — Encounter: Payer: Self-pay | Admitting: Family Medicine

## 2023-06-04 DIAGNOSIS — R1011 Right upper quadrant pain: Secondary | ICD-10-CM

## 2023-06-04 DIAGNOSIS — R142 Eructation: Secondary | ICD-10-CM | POA: Diagnosis not present

## 2023-06-04 MED ORDER — DICYCLOMINE HCL 20 MG PO TABS: 20.0000 mg | ORAL_TABLET | Freq: Four times a day (QID) | ORAL | 0 refills | Status: DC

## 2023-06-04 NOTE — Patient Instructions (Signed)
 Abdominal Pain, Adult  Pain in the abdomen (abdominal pain) can be caused by many things. In most cases, it gets better with no treatment or by being treated at home. But in some cases, it can be serious. Your health care provider will ask questions about your medical history and do a physical exam to try to figure out what is causing your pain. Follow these instructions at home: Medicines Take over-the-counter and prescription medicines only as told by your provider. Do not take medicines that help you poop (laxatives) unless told by your provider. General instructions Watch your condition for any changes. Drink enough fluid to keep your pee (urine) pale yellow. Contact a health care provider if: Your pain changes, gets worse, or lasts longer than expected. You have severe cramping or bloating in your abdomen, or you vomit. Your pain gets worse with meals, after eating, or with certain foods. You are constipated or have diarrhea for more than 2-3 days. You are not hungry, or you lose weight without trying. You have signs of dehydration. These may include: Dark pee, very little pee, or no pee. Cracked lips or dry mouth. Sleepiness or weakness. You have pain when you pee (urinate) or poop. Your abdominal pain wakes you up at night. You have blood in your pee. You have a fever. Get help right away if: You cannot stop vomiting. Your pain is only in one part of the abdomen. Pain on the right side could be caused by appendicitis. You have bloody or black poop (stool), or poop that looks like tar. You have trouble breathing. You have chest pain. These symptoms may be an emergency. Get help right away. Call 911. Do not wait to see if the symptoms will go away. Do not drive yourself to the hospital. This information is not intended to replace advice given to you by your health care provider. Make sure you discuss any questions you have with your health care provider. Document Revised:  11/08/2021 Document Reviewed: 11/08/2021 Elsevier Patient Education  2024 [employer].

## 2023-06-04 NOTE — Progress Notes (Signed)
 Virtual Visit Consent   Bradley Gavitt Jr., you are scheduled for a virtual visit with a [facility] provider today. Just as with appointments in the office, your consent must be obtained to participate. Your consent will be active for this visit and any virtual visit you may have with one of our providers in the next 365 days. If you have a MyChart account, a copy of this consent can be sent to you electronically.  As this is a virtual visit, video technology does not allow for your provider to perform a traditional examination. This may limit your provider's ability to fully assess your condition. If your provider identifies any concerns that need to be evaluated in person or the need to arrange testing (such as labs, EKG, etc.), we will make arrangements to do so. Although advances in technology are sophisticated, we cannot ensure that it will always work on either your end or our end. If the connection with a video visit is poor, the visit may have to be switched to a telephone visit. With either a video or telephone visit, we are not always able to ensure that we have a secure connection.  By engaging in this virtual visit, you consent to the provision of healthcare and authorize for your insurance to be billed (if applicable) for the services provided during this visit. Depending on your insurance coverage, you may receive a charge related to this service.  I need to obtain your verbal consent now. Are you willing to proceed with your visit today? Bradley Dalante Lebsack. has provided verbal consent on 06/04/2023 for a virtual visit (video or telephone). Albertha Huger, FNP  Date: 06/04/2023 5:07 PM   Virtual Visit via Video Note   I, Albertha Huger, connected with  Bradley Bernard.  ([id number], [birth year]) on 06/04/23 at  5:00 PM EDT by a video-enabled telemedicine application and verified that I am speaking with the correct person using two identifiers.  Location: Patient: Virtual Visit Location  Patient: Home Provider: Virtual Visit Location Provider: Home Office   I discussed the limitations of evaluation and management by telemedicine and the availability of in person appointments. The patient expressed understanding and agreed to proceed.    History of Present Illness: Bradley Bienvenue Jr. is a 53 y.o. who identifies as a male who was assigned male at birth, and is being seen today for bloating, RUQ discomfort and increased gas for several weeks with no fever, nausea or vomiting. He thought he was constipated however laxative did not help. He has HIDA scan neg many yrs ago. No difference with food. In no distress.   HPI: HPI  Problems:  Patient Active Problem List   Diagnosis Date Noted   Caregiver stress 05/01/2022   DDD (degenerative disc disease), lumbar 04/10/2022   Family history of Alzheimer's disease 02/09/2020   History of repair of ACL 11/10/2019   Erectile dysfunction 04/21/2018   Bilateral low back pain with left-sided sciatica 04/13/2014   Right shoulder pain 04/13/2014   Asthma 11/20/2010   POLYP, GALLBLADDER 02/02/2009   OTHER SPECIFIED DISORDERS OF LIVER 09/17/2007   Asthma with exacerbation 04/03/2007   Allergic rhinitis 02/12/2007   GERD 02/12/2007   Lipoma 11/22/2006   Hyperlipidemia 11/22/2006    Allergies: No Known Allergies Medications:  Current Outpatient Medications:    fluticasone  (FLONASE ) 50 MCG/ACT nasal spray, Place into both nostrils daily., Disp: , Rfl:    predniSONE  (DELTASONE ) 20 MG tablet, Take 2 tablets (40 mg total) by mouth daily with  breakfast., Disp: 10 tablet, Rfl: 0  Observations/Objective: Patient is well-developed, well-nourished in no acute distress.  Resting comfortably  at home.  Head is normocephalic, atraumatic.  No labored breathing.  Speech is clear and coherent with logical content.  Patient is alert and oriented at baseline.    Assessment and Plan: There are no diagnoses linked to this encounter. Start  omeprazole otc, pt requests dicyclomine, make apptmt with pcp for testing, ER for worsening sx  Follow Up Instructions: I discussed the assessment and treatment plan with the patient. The patient was provided an opportunity to ask questions and all were answered. The patient agreed with the plan and demonstrated an understanding of the instructions.  A copy of instructions were sent to the patient via MyChart unless otherwise noted below.     The patient was advised to call back or seek an in-person evaluation if the symptoms worsen or if the condition fails to improve as anticipated.    Monaca Wadas, FNP

## 2023-06-05 NOTE — Telephone Encounter (Signed)
 Needs appt first as we may have to submit doc to get any additional imaging coverd by insurance

## 2023-06-11 ENCOUNTER — Ambulatory Visit (INDEPENDENT_AMBULATORY_CARE_PROVIDER_SITE_OTHER): Admitting: Physician Assistant

## 2023-06-11 ENCOUNTER — Encounter: Payer: Self-pay | Admitting: Physician Assistant

## 2023-06-11 VITALS — BP 120/80 | HR 67 | Ht 67.0 in | Wt 191.0 lb

## 2023-06-11 DIAGNOSIS — R140 Abdominal distension (gaseous): Secondary | ICD-10-CM

## 2023-06-11 DIAGNOSIS — R143 Flatulence: Secondary | ICD-10-CM | POA: Diagnosis not present

## 2023-06-11 NOTE — Progress Notes (Signed)
 Acute Office Visit  Subjective:     Patient ID: Bradley Schu., male    DOB: Jul 06, 1970, 53 y.o.   MRN: [id number]  Chief Complaint  Patient presents with   Abdominal Pain    RUQ discomfort and increased gas for several weeks with no fever, nausea or vomiting. He thought he was constipated however laxative did not help. He has HIDA scan neg many yrs ago. No difference with food. In no distress.told to start omeprazole otc.    HPI Patient is a 53 yo male who presents to the clinic with several weeks of abdominal bloating, belching, gas. He denies any nausea or vomiting. He has not changed his diet at all. He has taken some tums here and there and seem to help a little. He denies any alcohol or NSAID use. His stools have been on the constipated side and used miralax. His stools are firm without melena or hematochezia. Bentyl  helps with abdominal discomfort. He had a virtual visit on 4/29 and was told to start omeprazole OTC. He has had no benefit with omeprazole. He denies any acid reflux.     ROS See HPI.      Objective:    BP 120/80   Pulse 67   Ht 5\' 7"  (1.702 m)   Wt 191 lb (86.6 kg)   SpO2 99%   BMI 29.91 kg/m  BP Readings from Last 3 Encounters:  06/11/23 120/80  04/29/23 130/79  06/18/22 109/66   Wt Readings from Last 3 Encounters:  06/11/23 191 lb (86.6 kg)  04/29/23 189 lb (85.7 kg)  06/18/22 184 lb (83.5 kg)      Physical Exam Constitutional:      Appearance: He is well-developed.  HENT:     Head: Normocephalic.  Cardiovascular:     Rate and Rhythm: Normal rate and regular rhythm.  Pulmonary:     Effort: Pulmonary effort is normal.  Abdominal:     General: Bowel sounds are normal. There is no distension.     Palpations: Abdomen is soft.     Tenderness: There is no abdominal tenderness.     Hernia: No hernia is present.  Neurological:     Mental Status: He is alert.  Psychiatric:        Mood and Affect: Mood normal.            Assessment & Plan:  Aaron AasAaron AasWilfredo was seen today for abdominal pain.  Diagnoses and all orders for this visit:  Abdominal bloating -     CMP14+EGFR -     Lipase -     CBC w/Diff/Platelet -     US  Abdomen Complete; Future -     omeprazole (PRILOSEC) 40 MG capsule; Take 1 capsule (40 mg total) by mouth daily.  Flatulence -     CMP14+EGFR -     Lipase -     CBC w/Diff/Platelet -     US  Abdomen Complete; Future -     omeprazole (PRILOSEC) 40 MG capsule; Take 1 capsule (40 mg total) by mouth daily.   No red flag abdominal symptoms today-no abdominal tenderness at all on exam Will get U/S of abdomen CBC/CMP/lipase ordered to look for signs of blood loss, liver inflammation, pancreatitis, cholecystitis  Start probiotic daily  Has bentyl  to use as needed  Omeprazole 40mg  given to start daily Keep food diary and avoid dairy and gluten Follow up as needed if symptoms persist or worsen follow up sooner   Return if symptoms  worsen or fail to improve.  Marcedes Tech, PA-C

## 2023-06-11 NOTE — Patient Instructions (Signed)
 Get abdominal u/s Start probiotic daily  Abdominal Bloating: What to Know  Belly bloating is when your belly may feel full, tight, or painful. It may also look bigger or swollen. This is also called abdominal bloating. Common causes of belly bloating include: Swallowing air. Trouble pooping (constipation). Problems digesting food. Lactose intolerance. This means you can't digest lactose normally, a natural sugar in dairy. Celiac disease. This means you can't digest gluten, a protein found in wheat). Slow movement of food in the stomach and small intestine. Eating too much. Irritable bowel syndrome (IBS). This affects the large intestine. Gastroesophageal reflux disease (GERD). This is when stomach acid goes back into the throat. Urinary retention. This is when the bladder can't be emptied all the way. Follow these instructions at home: Eating and drinking Avoid eating too much. Try not to swallow air while talking or eating. Avoid eating while lying down. Avoid foods and drinks that may trigger your bloating. Depending on the cause this may include: Foods that cause gas, such as broccoli, cabbage, cauliflower, and baked beans. Fizzy, or carbonated, drinks. Hard candy. Chewing gum. Dairy. Medicines Take your medicines only as told. Ask your provider if you should take a probiotic. Probiotics have good bacteria or yeast that can help with digestion. General instructions Exercise regularly to help with bloating from gas and any trouble with pooping. You may need to take steps to help treat or prevent trouble pooping, such as: Taking medicines to help you poop. Eating foods high in fiber, like beans, whole grains, and fresh fruits and vegetables. Drinking more fluids as told. Contact a health care provider if: You throw up or feel like you may throw up. You have watery poop, or diarrhea, that doesn't stop. You have belly pain that gets worse and medicines don't help. You are  gaining or losing weight unexpectedly. Get help right away if: You have chest pain. You have trouble breathing. You feel short of breath. You have trouble peeing. You have dark pee or pee that smells bad. You have blood in your poop or dark, tarry poop. These symptoms may be an emergency. Call 911 right away. Do not wait to see if the symptoms will go away. Do not drive yourself to the hospital. This information is not intended to replace advice given to you by your health care provider. Make sure you discuss any questions you have with your health care provider. Document Revised: 09/12/2022 Document Reviewed: 09/12/2022 Elsevier Patient Education  2024 [employer].

## 2023-06-12 ENCOUNTER — Encounter: Payer: Self-pay | Admitting: Physician Assistant

## 2023-06-12 LAB — CMP14+EGFR
ALT: 19 IU/L (ref 0–44)
AST: 18 IU/L (ref 0–40)
Albumin: 4.3 g/dL (ref 3.8–4.9)
Alkaline Phosphatase: 63 IU/L (ref 44–121)
BUN/Creatinine Ratio: 9 (ref 9–20)
BUN: 11 mg/dL (ref 6–24)
Bilirubin Total: 0.4 mg/dL (ref 0.0–1.2)
CO2: 24 mmol/L (ref 20–29)
Calcium: 9.5 mg/dL (ref 8.7–10.2)
Chloride: 103 mmol/L (ref 96–106)
Creatinine, Ser: 1.17 mg/dL (ref 0.76–1.27)
Globulin, Total: 2.8 g/dL (ref 1.5–4.5)
Glucose: 92 mg/dL (ref 70–99)
Potassium: 4.7 mmol/L (ref 3.5–5.2)
Sodium: 140 mmol/L (ref 134–144)
Total Protein: 7.1 g/dL (ref 6.0–8.5)
eGFR: 74 mL/min/{1.73_m2} (ref >59)

## 2023-06-12 LAB — LIPASE: Lipase: 45 U/L (ref 13–78)

## 2023-06-12 LAB — CBC WITH DIFFERENTIAL/PLATELET
Basophils Absolute: 0 10*3/uL (ref 0.0–0.2)
Basos: 1 %
EOS (ABSOLUTE): 0.1 10*3/uL (ref 0.0–0.4)
Eos: 2 %
Hematocrit: 47.3 % (ref 37.5–51.0)
Hemoglobin: 15.7 g/dL (ref 13.0–17.7)
Immature Grans (Abs): 0 10*3/uL (ref 0.0–0.1)
Immature Granulocytes: 0 %
Lymphocytes Absolute: 1.7 10*3/uL (ref 0.7–3.1)
Lymphs: 29 %
MCH: 29.6 pg (ref 26.6–33.0)
MCHC: 33.2 g/dL (ref 31.5–35.7)
MCV: 89 fL (ref 79–97)
Monocytes Absolute: 0.7 10*3/uL (ref 0.1–0.9)
Monocytes: 12 %
Neutrophils Absolute: 3.2 10*3/uL (ref 1.4–7.0)
Neutrophils: 56 %
Platelets: 274 10*3/uL (ref 150–450)
RBC: 5.3 x10E6/uL (ref 4.14–5.80)
RDW: 12.6 % (ref 11.6–15.4)
WBC: 5.7 10*3/uL (ref 3.4–10.8)

## 2023-06-12 NOTE — Progress Notes (Signed)
 Jadrien,   Normal glucose, kidney, liver.  Lipase normal.  Normal hemoglobin and WBC.   Labs reassuring!

## 2023-06-14 ENCOUNTER — Encounter: Payer: Self-pay | Admitting: Physician Assistant

## 2023-06-14 ENCOUNTER — Ambulatory Visit (INDEPENDENT_AMBULATORY_CARE_PROVIDER_SITE_OTHER)

## 2023-06-14 DIAGNOSIS — R109 Unspecified abdominal pain: Secondary | ICD-10-CM | POA: Diagnosis not present

## 2023-06-14 DIAGNOSIS — R140 Abdominal distension (gaseous): Secondary | ICD-10-CM

## 2023-06-14 DIAGNOSIS — R143 Flatulence: Secondary | ICD-10-CM

## 2023-06-14 DIAGNOSIS — K824 Cholesterolosis of gallbladder: Secondary | ICD-10-CM | POA: Insufficient documentation

## 2023-06-14 MED ORDER — OMEPRAZOLE 40 MG PO CPDR: 40.0000 mg | DELAYED_RELEASE_CAPSULE | Freq: Every day | ORAL | 3 refills | Status: AC

## 2023-06-14 NOTE — Progress Notes (Signed)
 3mm stone or gallstone noted in gallbladder with no other acute findings. This could be responsible for some on your symptoms. If you try the things discussed at appt and no improvement consider surgery consult for removal.

## 2023-07-04 ENCOUNTER — Ambulatory Visit (INDEPENDENT_AMBULATORY_CARE_PROVIDER_SITE_OTHER): Admitting: Family Medicine

## 2023-07-04 VITALS — BP 122/76 | HR 62 | Ht 67.0 in | Wt 190.0 lb

## 2023-07-04 DIAGNOSIS — Z0000 Encounter for general adult medical examination without abnormal findings: Secondary | ICD-10-CM | POA: Diagnosis not present

## 2023-07-04 NOTE — Progress Notes (Unsigned)
 Complete physical exam  Patient: Bradley Shivley Jr.   DOB: [birth year]   53 y.o. Male  MRN: [id number]  Subjective:     No chief complaint on file.   Bradley Kuper Jr. is a 53 y.o. male who presents today for a complete physical exam. He reports consuming a general diet. {types:19826} He generally feels {DESC; WELL/FAIRLY WELL/POORLY:18703}. He reports sleeping {DESC; WELL/FAIRLY WELL/POORLY:18703}. He {does/does not:200015} have additional problems to discuss today.    Most recent fall risk assessment:    06/18/2022    8:10 AM  Fall Risk   Falls in the past year? 0  Number falls in past yr: 0  Injury with Fall? 0  Follow up Falls evaluation completed     Most recent depression screenings:    05/01/2022    1:13 PM 06/13/2021    8:56 AM  PHQ 2/9 Scores  PHQ - 2 Score 0 0    {VISON DENTAL STD PSA (Optional):27386}  {History (Optional):23778}  Patient Care Team: Cydney Draft, MD as PCP - General (Family Medicine)   Outpatient Medications Prior to Visit  Medication Sig  . dicyclomine  (BENTYL ) 20 MG tablet Take 1 tablet (20 mg total) by mouth every 6 (six) hours for 10 days.  . fluticasone  (FLONASE ) 50 MCG/ACT nasal spray Place into both nostrils daily.  . omeprazole  (PRILOSEC) 40 MG capsule Take 1 capsule (40 mg total) by mouth daily.   No facility-administered medications prior to visit.    ROS        Objective:     There were no vitals taken for this visit. {Vitals History (Optional):23777}  Physical Exam Constitutional:      Appearance: Normal appearance.  HENT:     Head: Normocephalic and atraumatic.     Right Ear: Tympanic membrane, ear canal and external ear normal.     Left Ear: Tympanic membrane, ear canal and external ear normal.     Nose: Nose normal.     Mouth/Throat:     Pharynx: Oropharynx is clear.  Eyes:     Extraocular Movements: Extraocular movements intact.     Conjunctiva/sclera: Conjunctivae normal.     Pupils:  Pupils are equal, round, and reactive to light.  Neck:     Thyroid: No thyromegaly.  Cardiovascular:     Rate and Rhythm: Normal rate and regular rhythm.  Pulmonary:     Effort: Pulmonary effort is normal.     Breath sounds: Normal breath sounds.  Abdominal:     General: Bowel sounds are normal.     Palpations: Abdomen is soft.     Tenderness: There is no abdominal tenderness.  Musculoskeletal:        General: No swelling.     Cervical back: Neck supple.  Skin:    General: Skin is warm and dry.  Neurological:     Mental Status: He is oriented to person, place, and time.  Psychiatric:        Mood and Affect: Mood normal.        Behavior: Behavior normal.     No results found for any visits on 07/04/23. {Show previous labs (optional):23779}    Assessment & Plan:    Routine Health Maintenance and Physical Exam  Immunization History  Administered Date(s) Administered  . Influenza Split 10/23/2012  . Influenza Whole 11/16/2008  . Influenza,inj,Quad PF,6+ Mos 10/29/2013, 02/04/2015, 11/06/2015, 11/03/2021  . PFIZER(Purple Top)SARS-COV-2 Vaccination 05/21/2019, 06/11/2019  . Pfizer(Comirnaty )Fall Seasonal Vaccine 12 years and older 11/21/2021  .  Td 02/05/2001  . Tdap 08/03/2016, 08/26/2021  . Zoster Recombinant(Shingrix ) 06/13/2021, 05/01/2022    Health Maintenance  Topic Date Due  . Pneumococcal Vaccine 28-22 Years old (1 of 2 - PCV) Never done  . COVID-19 Vaccine (4 - 2024-25 season) 10/07/2022  . INFLUENZA VACCINE  09/06/2023  . DTaP/Tdap/Td (4 - Td or Tdap) 08/27/2031  . Colonoscopy  10/19/2031  . Hepatitis C Screening  Completed  . HIV Screening  Completed  . Zoster Vaccines- Shingrix   Completed  . HPV VACCINES  Aged Out  . Meningococcal B Vaccine  Aged Out    Discussed health benefits of physical activity, and encouraged him to engage in regular exercise appropriate for his age and condition.  Problem List Items Addressed This Visit   None Visit Diagnoses        Wellness examination    -  Primary       Keep up a regular exercise program and make sure you are eating a healthy diet Try to eat 4 servings of dairy a day, or if you are lactose intolerant take a calcium with vitamin D daily.  Your vaccines are up to date.   No follow-ups on file.     Duaine German, MD

## 2023-07-05 ENCOUNTER — Ambulatory Visit: Payer: Self-pay | Admitting: Family Medicine

## 2023-07-05 ENCOUNTER — Encounter: Payer: Self-pay | Admitting: Family Medicine

## 2023-07-05 LAB — CMP14+EGFR
ALT: 25 IU/L (ref 0–44)
AST: 20 IU/L (ref 0–40)
Albumin: 4.4 g/dL (ref 3.8–4.9)
Alkaline Phosphatase: 62 IU/L (ref 44–121)
BUN/Creatinine Ratio: 10 (ref 9–20)
BUN: 12 mg/dL (ref 6–24)
Bilirubin Total: 0.6 mg/dL (ref 0.0–1.2)
CO2: 23 mmol/L (ref 20–29)
Calcium: 9.8 mg/dL (ref 8.7–10.2)
Chloride: 101 mmol/L (ref 96–106)
Creatinine, Ser: 1.17 mg/dL (ref 0.76–1.27)
Globulin, Total: 3.1 g/dL (ref 1.5–4.5)
Glucose: 89 mg/dL (ref 70–99)
Potassium: 4.4 mmol/L (ref 3.5–5.2)
Sodium: 140 mmol/L (ref 134–144)
Total Protein: 7.5 g/dL (ref 6.0–8.5)
eGFR: 74 mL/min/{1.73_m2} (ref >59)

## 2023-07-05 LAB — LIPID PANEL
Chol/HDL Ratio: 5.7 ratio — ABNORMAL HIGH (ref 0.0–5.0)
Cholesterol, Total: 200 mg/dL — ABNORMAL HIGH (ref 100–199)
HDL: 35 mg/dL — ABNORMAL LOW (ref >39)
LDL Chol Calc (NIH): 140 mg/dL — ABNORMAL HIGH (ref 0–99)
Triglycerides: 140 mg/dL (ref 0–149)
VLDL Cholesterol Cal: 25 mg/dL (ref 5–40)

## 2023-07-05 LAB — PSA: Prostate Specific Ag, Serum: 1.1 ng/mL (ref 0.0–4.0)

## 2023-07-05 LAB — TESTOSTERONE, FREE, TOTAL, SHBG
Sex Hormone Binding: 81.6 nmol/L — ABNORMAL HIGH (ref 19.3–76.4)
Testosterone, Free: 12.6 pg/mL (ref 7.2–24.0)
Testosterone: 812 ng/dL (ref 264–916)

## 2023-07-05 NOTE — Progress Notes (Signed)
 Good morning, LDL cholesterol is quite elevated at 140 goal is less than 100.  Triglycerides look good.  Just encouraged her to continue to work on healthy diet and shoot for about 20 minutes of aerobic moderate intensity exercise for 20 minutes 5 days a week to also improve cholesterol the Mediterranean diet is a great way to improve cardiovascular risk.  The prostate test is normal.  Metabolic panel including liver and kidney function looks good.  Testosterone  panel still pending.  The 10-year ASCVD risk score (Arnett DK, et al., 2019) is: 6.5%   Values used to calculate the score:     Age: 53 years     Sex: Male     Is Non-Hispanic African American: No     Diabetic: No     Tobacco smoker: No     Systolic Blood Pressure: 122 mmHg     Is BP treated: No     HDL Cholesterol: 35 mg/dL     Total Cholesterol: 200 mg/dL

## 2023-07-05 NOTE — Progress Notes (Signed)
 Testosterone  levels look good overall.

## 2023-07-15 ENCOUNTER — Encounter: Payer: Self-pay | Admitting: Family Medicine

## 2023-07-15 DIAGNOSIS — R1011 Right upper quadrant pain: Secondary | ICD-10-CM

## 2023-07-17 MED ORDER — DICYCLOMINE HCL 20 MG PO TABS: 20.0000 mg | ORAL_TABLET | Freq: Three times a day (TID) | ORAL | 3 refills | Status: AC

## 2023-08-03 ENCOUNTER — Encounter: Payer: Self-pay | Admitting: Family Medicine

## 2023-08-05 ENCOUNTER — Other Ambulatory Visit: Payer: Self-pay | Admitting: Urology

## 2023-08-05 DIAGNOSIS — N451 Epididymitis: Secondary | ICD-10-CM

## 2023-08-05 MED ORDER — DOXYCYCLINE HYCLATE 100 MG PO CAPS: 100.0000 mg | ORAL_CAPSULE | Freq: Two times a day (BID) | ORAL | 0 refills | Status: AC

## 2023-08-13 ENCOUNTER — Encounter: Payer: Self-pay | Admitting: Sports Medicine

## 2023-08-13 ENCOUNTER — Ambulatory Visit

## 2023-08-13 ENCOUNTER — Ambulatory Visit (INDEPENDENT_AMBULATORY_CARE_PROVIDER_SITE_OTHER): Admitting: Sports Medicine

## 2023-08-13 DIAGNOSIS — Z9889 Other specified postprocedural states: Secondary | ICD-10-CM

## 2023-08-13 DIAGNOSIS — S80911A Unspecified superficial injury of right knee, initial encounter: Secondary | ICD-10-CM | POA: Diagnosis not present

## 2023-08-13 MED ORDER — IBUPROFEN 800 MG PO TABS: 800.0000 mg | ORAL_TABLET | Freq: Three times a day (TID) | ORAL | 2 refills | Status: AC | PRN

## 2023-08-13 NOTE — Assessment & Plan Note (Signed)
 This is a very pleasant 53 year old male, he has a history of ACL reconstruction on the right knee, we saw him in 2021, he had a reinjury, MRI ultimately showed complete rupture of the ACL graft as well as some meniscal tearing. We treated him conservatively with analgesics, he got a custom DonJoy knee brace, formal physical therapy and did well. Last week he was at the beach with [place], he was not wearing his brace, he stepped in a hole in the send by accident and felt a shift in a pop. He had immediate pain, swelling. He is here today, still has swelling, he is in his custom DonJoy brace. There is a moderate effusion, tenderness is mostly medial tibia. He does have positive Lachman sign. Due to the effusion and tenderness at the tibia with mechanism I do think we should get x-rays followed by a new MRI looking for tibial fracture. He will do some ibuprofen  for pain, continue his bracing, I will add some home knee physical therapy to start working on. Follow-up depends on what we see on the MRI.

## 2023-08-13 NOTE — Progress Notes (Signed)
    Procedures performed today:    None.  Independent interpretation of notes and tests performed by another provider:   None.  Brief History, Exam, Impression, and Recommendations:    History of repair of right ACL status post graft rupture This is a very pleasant 53 year old male, he has a history of ACL reconstruction on the right knee, we saw him in 2021, he had a reinjury, MRI ultimately showed complete rupture of the ACL graft as well as some meniscal tearing. We treated him conservatively with analgesics, he got a custom DonJoy knee brace, formal physical therapy and did well. Last week he was at the beach with his family, he was not wearing his brace, he stepped in a hole in the send by accident and felt a shift in a pop. He had immediate pain, swelling. He is here today, still has swelling, he is in his custom DonJoy brace. There is a moderate effusion, tenderness is mostly medial tibia. He does have positive Lachman sign. Due to the effusion and tenderness at the tibia with mechanism I do think we should get x-rays followed by a new MRI looking for tibial fracture. He will do some ibuprofen  for pain, continue his bracing, I will add some home knee physical therapy to start working on. Follow-up depends on what we see on the MRI.    ____________________________________________ Debby PARAS. Curtis, M.D., ABFM., CAQSM., AME. Primary Care and Sports Medicine [facility]  Adjunct Professor of [facility] Medicine  [employer]  Restaurant manager, fast food

## 2023-08-15 ENCOUNTER — Ambulatory Visit: Payer: Self-pay | Admitting: Sports Medicine

## 2023-08-18 ENCOUNTER — Ambulatory Visit

## 2023-08-18 DIAGNOSIS — M23611 Other spontaneous disruption of anterior cruciate ligament of right knee: Secondary | ICD-10-CM | POA: Diagnosis not present

## 2023-08-18 DIAGNOSIS — Z9889 Other specified postprocedural states: Secondary | ICD-10-CM

## 2023-08-27 ENCOUNTER — Encounter: Payer: Self-pay | Admitting: Sports Medicine

## 2023-08-27 NOTE — Telephone Encounter (Signed)
 Please call Bradley Bernard to have this guy fit for another custom DonJoy knee brace.

## 2023-08-28 ENCOUNTER — Ambulatory Visit: Admitting: Urology

## 2023-08-28 NOTE — Progress Notes (Deleted)
 Assessment: 1. Scrotal pain   2. Epididymitis     Plan: Begin doxycycline  100 mg twice daily x 14 days.  Prescription sent. Ibuprofen  600 mg 3 times daily x 5 days, may repeat x 1. Return to office in 1 month. Consider further evaluation with a scrotal ultrasound if symptoms persist following treatment   Chief Complaint:  No chief complaint on file.   History of Present Illness:  Bradley Bernard. is a 53 y.o. male who is seen for continued evaluation of recurrent epididymitis and left scrotal pain. He underwent a vasectomy in 2003 and since that time has had recurrent current episodes of epididymitis.  These episodes occur approximately 1 time per year.  He typically has symptoms on the left side.  He reports pain in the scrotum.  His symptoms typically resolve with antibiotic therapy.  No scrotal swelling or redness associated with these episodes.  He has also noted a change in the appearance of his seminal fluid with a yellowish color.  No hematospermia. He has previously been evaluated by Dr. Rozell.  He was last seen by her in April 2018.   His last episode of epididymitis was approximately 2 years ago. No recent imaging studies have been performed. PSA 5/23: 0.73   His visit in January 2024, he reported discomfort in the right scrotum for 4-5 days.  No scrotal swelling or redness.  No lower urinary tract symptoms.  No dysuria or gross hematuria. IPSS = 0. His exam was consistent with right epididymitis and a left epididymal cyst. He was treated with doxycycline  and ibuprofen .  He did not return for follow-up. He had onset of left scrotal pain in June 2025.  He was given a prescription for doxycycline .  Portions of the above documentation were copied from a prior visit for review purposes only.    Past Medical History:  Past Medical History:  Diagnosis Date   Asthma    Seasonal allergies     Past Surgical History:  Past Surgical History:  Procedure  Laterality Date   Right knee arthroscopic assisted anterior cruciate ligament tear with Achilles allograft. Right 06/20/2012   right knee meniscu tear   Right knee medial and lateral meniscectomies Right 06/20/2012    Allergies:  No Known Allergies  Family History:  Family History  Problem Relation Age of Onset   Breast cancer Mother    Hypertension Father     Social History:  Social History   Tobacco Use   Smoking status: Former    Current packs/day: 1.00    Average packs/day: 1 pack/day for 13.0 years (13.0 ttl pk-yrs)    Types: Cigarettes   Smokeless tobacco: Never  Vaping Use   Vaping status: Never Used  Substance Use Topics   Alcohol use: No   Drug use: No    ROS: Constitutional:  Negative for fever, chills, weight loss CV: Negative for chest pain, previous MI, hypertension Respiratory:  Negative for shortness of breath, wheezing, sleep apnea, frequent cough GI:  Negative for nausea, vomiting, bloody stool, GERD  Physical exam: There were no vitals taken for this visit. GENERAL APPEARANCE:  Well appearing, well developed, well nourished, NAD HEENT:  Atraumatic, normocephalic, oropharynx clear NECK:  Supple without lymphadenopathy or thyromegaly ABDOMEN:  Soft, non-tender, no masses EXTREMITIES:  Moves all extremities well, without clubbing, cyanosis, or edema NEUROLOGIC:  Alert and oriented x 3, normal gait, CN II-XII grossly intact MENTAL STATUS:  appropriate BACK:  Non-tender to palpation, No CVAT SKIN:  Warm, dry, and intact GU: Penis:  {Exam; penis:5791} Meatus: {Meatus:15530} Scrotum: {pe scrotum:310183} Testis: {Exam; testicles:5790} Epididymis: {epididymis zkjf:688612} Prostate: {Exam; prostate:5793} Rectum: {rectal exam:26517}   Results: U/A:

## 2023-09-10 ENCOUNTER — Ambulatory Visit: Admitting: Sports Medicine

## 2023-09-17 ENCOUNTER — Ambulatory Visit (INDEPENDENT_AMBULATORY_CARE_PROVIDER_SITE_OTHER): Admitting: Sports Medicine

## 2023-09-17 ENCOUNTER — Encounter: Payer: Self-pay | Admitting: Sports Medicine

## 2023-09-17 DIAGNOSIS — M2351 Chronic instability of knee, right knee: Secondary | ICD-10-CM | POA: Diagnosis not present

## 2023-09-17 DIAGNOSIS — Z9889 Other specified postprocedural states: Secondary | ICD-10-CM

## 2023-09-17 NOTE — Progress Notes (Signed)
    Procedures performed today:    None.  Independent interpretation of notes and tests performed by another provider:   None.  Brief History, Exam, Impression, and Recommendations:    History of repair of right ACL status post graft rupture This is a very pleasant 53 year old male, he has a history of ACL reconstruction, he had a reinjury back in 2021 that showed recurrent complete rupture of the actual ACL graft. He also had some meniscal tearing. He desired more of a conservative approach so we treated him with some formal therapy as well as a [employer] custom knee brace. He did really well until recently, approximately a month ago he was at the beach with his family, not wearing the brace, stepped in a hole and felt a pop. He had a moderate effusion, ultimately an MRI did confirm the existing ACL graft tear, existing meniscal tear, and he also had some pivot shift bone bruising. As the bone bruising was the likely cause of his pain we treated him conservatively, he returns today pain-free with full motion and full function, his brace however has seen better days. I would like to get Aleck involved to get him fit for a new [employer] custom brace. He can return to see me on an as needed basis.  Patient is ambulatory and will benefit greatly from a custom knee brace, knee brace does need to be custom due to dynamic valgus.    ____________________________________________ Debby PARAS. Curtis, M.D., ABFM., CAQSM., AME. Primary Care and Sports Medicine [facility]  Adjunct Professor of [facility] Medicine  [employer]  Restaurant manager, fast food

## 2023-09-17 NOTE — Assessment & Plan Note (Signed)
 This is a very pleasant 53 year old male, he has a history of ACL reconstruction, he had a reinjury back in 2021 that showed recurrent complete rupture of the actual ACL graft. He also had some meniscal tearing. He desired more of a conservative approach so we treated him with some formal therapy as well as a [employer] custom knee brace. He did really well until recently, approximately a month ago he was at the beach with his family, not wearing the brace, stepped in a hole and felt a pop. He had a moderate effusion, ultimately an MRI did confirm the existing ACL graft tear, existing meniscal tear, and he also had some pivot shift bone bruising. As the bone bruising was the likely cause of his pain we treated him conservatively, he returns today pain-free with full motion and full function, his brace however has seen better days. I would like to get Aleck involved to get him fit for a new [employer] custom brace. He can return to see me on an as needed basis.  Qualification for [employer] custom knee brace The patient is ambulatory and has pseudo instability of the knee confirmed by valgus stress testing. I am ordering a custom knee brace due to the patient's quad to calf ratio.

## 2023-10-08 ENCOUNTER — Encounter: Payer: Self-pay | Admitting: Sports Medicine

## 2023-10-10 ENCOUNTER — Telehealth: Payer: Self-pay | Admitting: Urology

## 2023-10-10 ENCOUNTER — Ambulatory Visit: Admitting: Urology

## 2023-10-10 NOTE — Telephone Encounter (Signed)
 Called and lvm on 10/10/23 pt called on 10/09/23 and lvm ANN

## 2023-11-03 ENCOUNTER — Other Ambulatory Visit: Payer: Self-pay | Admitting: Urology

## 2023-11-03 DIAGNOSIS — N451 Epididymitis: Secondary | ICD-10-CM

## 2023-12-04 ENCOUNTER — Ambulatory Visit (INDEPENDENT_AMBULATORY_CARE_PROVIDER_SITE_OTHER): Admitting: Family Medicine

## 2023-12-04 DIAGNOSIS — Z23 Encounter for immunization: Secondary | ICD-10-CM | POA: Diagnosis not present

## 2023-12-26 ENCOUNTER — Encounter: Payer: Self-pay | Admitting: Urology

## 2023-12-26 ENCOUNTER — Ambulatory Visit: Admitting: Urology

## 2023-12-26 VITALS — BP 137/78 | HR 59 | Ht 67.0 in | Wt 187.0 lb

## 2023-12-26 DIAGNOSIS — N451 Epididymitis: Secondary | ICD-10-CM | POA: Diagnosis not present

## 2023-12-26 DIAGNOSIS — N401 Enlarged prostate with lower urinary tract symptoms: Secondary | ICD-10-CM | POA: Diagnosis not present

## 2023-12-26 DIAGNOSIS — N529 Male erectile dysfunction, unspecified: Secondary | ICD-10-CM | POA: Diagnosis not present

## 2023-12-26 DIAGNOSIS — N138 Other obstructive and reflux uropathy: Secondary | ICD-10-CM

## 2023-12-26 LAB — URINALYSIS, ROUTINE W REFLEX MICROSCOPIC
Bilirubin, UA: NEGATIVE
Glucose, UA: NEGATIVE
Ketones, UA: NEGATIVE
Leukocytes,UA: NEGATIVE
Nitrite, UA: NEGATIVE
Protein,UA: NEGATIVE
Specific Gravity, UA: 1.02 (ref 1.005–1.030)
Urobilinogen, Ur: 0.2 mg/dL (ref 0.2–1.0)
pH, UA: 6 (ref 5.0–7.5)

## 2023-12-26 LAB — MICROSCOPIC EXAMINATION: Bacteria, UA: NONE SEEN

## 2023-12-26 MED ORDER — TADALAFIL 20 MG PO TABS
20.0000 mg | ORAL_TABLET | Freq: Every day | ORAL | 11 refills | Status: AC | PRN
Start: 2023-12-26 — End: ?

## 2023-12-26 NOTE — Progress Notes (Signed)
 Assessment: 1. Epididymitis   2. BPH with obstruction/lower urinary tract symptoms   3. Organic impotence     Plan: Discussed continued management of his recurrent epididymitis with antibiotics as needed. I discussed options for management of his intermittent lower urinary tract symptoms with medical therapy. I also discussed options for management of his erectile dysfunction with medical therapy. He would like to try tadalafil  20 mg as needed.  Prescription sent. Return to office in 1 year.  Chief Complaint:  Chief Complaint  Patient presents with   Epididymitis    History of Present Illness:  Bradley Bernard. is a 53 y.o. male who is seen for further evaluation of recurrent epididymitis. He underwent a vasectomy in 2003 and since that time has had recurrent current episodes of epididymitis.  These episodes occur approximately 1 time per year.  He typically has symptoms on the left side with pain in the scrotum.  His symptoms typically resolve with antibiotic therapy.  No scrotal swelling or redness associated with these episodes.  He also noted a change in the appearance of his seminal fluid with a yellowish color.  No hematospermia. He has previously been evaluated by Dr. Rozell.  He was last seen by her in April 2018. PSA 5/23: 0.73  PSA 5/25: 1.1  At his visit in January 2024, he reported discomfort in the right scrotum for 4-5 days.  No scrotal swelling or redness.  No lower urinary tract symptoms.  No dysuria or gross hematuria. IPSS = 0.  His exam was consistent with right epididymitis.  He was treated with doxycycline  and ibuprofen .   He returns today for follow-up.  He reports 1 episode of epididymitis since his visit in 2024.  This resolved with antibiotic therapy.  He is not having any current symptoms.  He reports intermittent lower urinary tract symptoms with some decrease stream and straining to empty.  No dysuria or gross hematuria. He also reports some  recent erectile dysfunction.  He has some difficulty maintaining his erections.  He associates this with increased stress with his home situation as he is taking care of his father and mother and has significant work related stress.  He has not tried any medical therapy to date.  No pain or curvature with erections.   Portions of the above documentation were copied from a prior visit for review purposes only.   Past Medical History:  Past Medical History:  Diagnosis Date   Allergy 2000   Asthma    Seasonal allergies     Past Surgical History:  Past Surgical History:  Procedure Laterality Date   Right knee arthroscopic assisted anterior cruciate ligament tear with Achilles allograft. Right 06/20/2012   right knee meniscu tear   Right knee medial and lateral meniscectomies Right 06/20/2012    Allergies:  No Known Allergies  Family History:  Family History  Problem Relation Age of Onset   Breast cancer Mother    Cancer Mother    Hypertension Father     Social History:  Social History   Tobacco Use   Smoking status: Former    Current packs/day: 1.00    Average packs/day: 1 pack/day for 13.0 years (13.0 ttl pk-yrs)    Types: Cigarettes   Smokeless tobacco: Never  Vaping Use   Vaping status: Never Used  Substance Use Topics   Alcohol use: No   Drug use: No    ROS: Constitutional:  Negative for fever, chills, weight loss CV: Negative for chest pain,  previous MI, hypertension Respiratory:  Negative for shortness of breath, wheezing, sleep apnea, frequent cough GI:  Negative for nausea, vomiting, bloody stool, GERD  Physical exam: BP 137/78   Pulse (!) 59   Ht 5' 7 (1.702 m)   Wt 187 lb (84.8 kg)   BMI 29.29 kg/m  GENERAL APPEARANCE:  Well appearing, well developed, well nourished, NAD HEENT:  Atraumatic, normocephalic, oropharynx clear NECK:  Supple without lymphadenopathy or thyromegaly ABDOMEN:  Soft, non-tender, no masses EXTREMITIES:  Moves all  extremities well, without clubbing, cyanosis, or edema NEUROLOGIC:  Alert and oriented x 3, normal gait, CN II-XII grossly intact MENTAL STATUS:  appropriate BACK:  Non-tender to palpation, No CVAT SKIN:  Warm, dry, and intact GU: Prostate: 40 g, NT, no nodules Rectum: Normal tone,  no masses or tenderness   Results: U/A: 0-5 WBCs, 0-2 RBCs

## 2023-12-28 ENCOUNTER — Encounter: Payer: Self-pay | Admitting: Urology

## 2024-07-06 ENCOUNTER — Encounter: Admitting: Family Medicine

## 2024-12-25 ENCOUNTER — Ambulatory Visit: Admitting: Urology
